# Patient Record
Sex: Female | Born: 1965 | Race: White | Hispanic: No | Marital: Married | Smoking: Former smoker
Health system: Southern US, Community
[De-identification: ages and names within clinical notes are randomized; demographics above are authoritative.]

## PROBLEM LIST (undated history)

## (undated) DIAGNOSIS — M1990 Unspecified osteoarthritis, unspecified site: Secondary | ICD-10-CM

## (undated) DIAGNOSIS — I10 Essential (primary) hypertension: Secondary | ICD-10-CM

## (undated) DIAGNOSIS — K219 Gastro-esophageal reflux disease without esophagitis: Secondary | ICD-10-CM

## (undated) HISTORY — PX: ABDOMINAL HYSTERECTOMY: SHX81

## (undated) HISTORY — PX: KNEE ARTHROPLASTY: SHX992

---

## 2011-08-17 ENCOUNTER — Ambulatory Visit: Payer: Self-pay | Admitting: Family Medicine

## 2013-09-30 ENCOUNTER — Ambulatory Visit: Payer: Self-pay | Admitting: Gastroenterology

## 2014-02-05 DIAGNOSIS — I10 Essential (primary) hypertension: Secondary | ICD-10-CM | POA: Insufficient documentation

## 2014-02-05 DIAGNOSIS — G43909 Migraine, unspecified, not intractable, without status migrainosus: Secondary | ICD-10-CM | POA: Insufficient documentation

## 2014-02-05 DIAGNOSIS — K219 Gastro-esophageal reflux disease without esophagitis: Secondary | ICD-10-CM | POA: Insufficient documentation

## 2014-02-05 DIAGNOSIS — E559 Vitamin D deficiency, unspecified: Secondary | ICD-10-CM | POA: Insufficient documentation

## 2014-02-05 DIAGNOSIS — M129 Arthropathy, unspecified: Secondary | ICD-10-CM | POA: Insufficient documentation

## 2014-02-05 DIAGNOSIS — J309 Allergic rhinitis, unspecified: Secondary | ICD-10-CM | POA: Insufficient documentation

## 2014-02-05 DIAGNOSIS — E785 Hyperlipidemia, unspecified: Secondary | ICD-10-CM | POA: Insufficient documentation

## 2015-09-21 ENCOUNTER — Other Ambulatory Visit: Payer: Self-pay | Admitting: Surgery

## 2015-09-21 DIAGNOSIS — R590 Localized enlarged lymph nodes: Secondary | ICD-10-CM

## 2015-09-28 ENCOUNTER — Ambulatory Visit
Admission: RE | Admit: 2015-09-28 | Discharge: 2015-09-28 | Disposition: A | Discharge status: Home / Self Care | Payer: Managed Care, Other (non HMO) | Source: Ambulatory Visit | Attending: Surgery | Admitting: Surgery

## 2015-09-28 DIAGNOSIS — R590 Localized enlarged lymph nodes: Secondary | ICD-10-CM | POA: Diagnosis present

## 2015-09-28 HISTORY — DX: Essential (primary) hypertension: I10

## 2015-09-28 MED ORDER — IOPAMIDOL (ISOVUE-370) INJECTION 76%
75.0000 mL | Freq: Once | INTRAVENOUS | Status: AC | PRN
  Administered 2015-09-28: 75 mL via INTRAVENOUS

## 2016-04-19 ENCOUNTER — Other Ambulatory Visit: Payer: Self-pay | Admitting: Family Medicine

## 2016-04-19 DIAGNOSIS — Z1231 Encounter for screening mammogram for malignant neoplasm of breast: Secondary | ICD-10-CM

## 2016-04-22 ENCOUNTER — Other Ambulatory Visit: Payer: Self-pay | Admitting: Family Medicine

## 2016-04-22 DIAGNOSIS — Z8781 Personal history of (healed) traumatic fracture: Secondary | ICD-10-CM

## 2016-05-04 ENCOUNTER — Ambulatory Visit
Admission: RE | Admit: 2016-05-04 | Discharge: 2016-05-04 | Disposition: A | Discharge status: Home / Self Care | Payer: BC Managed Care – PPO | Source: Ambulatory Visit | Attending: Family Medicine | Admitting: Family Medicine

## 2016-05-04 DIAGNOSIS — S2232XA Fracture of one rib, left side, initial encounter for closed fracture: Secondary | ICD-10-CM | POA: Diagnosis not present

## 2016-05-04 DIAGNOSIS — Z8781 Personal history of (healed) traumatic fracture: Secondary | ICD-10-CM

## 2016-05-04 DIAGNOSIS — R911 Solitary pulmonary nodule: Secondary | ICD-10-CM | POA: Insufficient documentation

## 2016-05-04 MED ORDER — IOPAMIDOL (ISOVUE-300) INJECTION 61%
75.0000 mL | Freq: Once | INTRAVENOUS | Status: AC | PRN
  Administered 2016-05-04: 75 mL via INTRAVENOUS

## 2016-05-10 ENCOUNTER — Ambulatory Visit: Payer: Managed Care, Other (non HMO)

## 2016-06-03 ENCOUNTER — Other Ambulatory Visit: Payer: Self-pay | Admitting: General Surgery

## 2016-06-03 ENCOUNTER — Other Ambulatory Visit (HOSPITAL_COMMUNITY): Payer: Self-pay | Admitting: General Surgery

## 2016-06-03 DIAGNOSIS — Z6841 Body Mass Index (BMI) 40.0 and over, adult: Principal | ICD-10-CM

## 2016-06-23 ENCOUNTER — Ambulatory Visit
Admission: RE | Admit: 2016-06-23 | Discharge: 2016-06-23 | Disposition: A | Discharge status: Home / Self Care | Payer: BC Managed Care – PPO | Source: Ambulatory Visit | Attending: General Surgery | Admitting: General Surgery

## 2016-06-23 ENCOUNTER — Other Ambulatory Visit: Payer: Self-pay

## 2016-06-23 DIAGNOSIS — K219 Gastro-esophageal reflux disease without esophagitis: Secondary | ICD-10-CM | POA: Insufficient documentation

## 2016-06-23 DIAGNOSIS — K449 Diaphragmatic hernia without obstruction or gangrene: Secondary | ICD-10-CM | POA: Diagnosis not present

## 2016-06-23 DIAGNOSIS — Z01818 Encounter for other preprocedural examination: Secondary | ICD-10-CM | POA: Diagnosis not present

## 2016-06-23 DIAGNOSIS — I4510 Unspecified right bundle-branch block: Secondary | ICD-10-CM | POA: Insufficient documentation

## 2016-06-23 DIAGNOSIS — Z01810 Encounter for preprocedural cardiovascular examination: Secondary | ICD-10-CM | POA: Diagnosis present

## 2016-06-23 DIAGNOSIS — Z6841 Body Mass Index (BMI) 40.0 and over, adult: Secondary | ICD-10-CM | POA: Diagnosis not present

## 2016-06-24 ENCOUNTER — Encounter: Payer: BC Managed Care – PPO | Attending: General Surgery | Admitting: Dietician

## 2016-06-24 VITALS — Ht 67.0 in | Wt 308.0 lb

## 2016-06-24 DIAGNOSIS — Z6841 Body Mass Index (BMI) 40.0 and over, adult: Secondary | ICD-10-CM

## 2016-06-24 DIAGNOSIS — E785 Hyperlipidemia, unspecified: Secondary | ICD-10-CM

## 2016-06-24 DIAGNOSIS — I10 Essential (primary) hypertension: Secondary | ICD-10-CM

## 2016-06-24 DIAGNOSIS — E6609 Other obesity due to excess calories: Secondary | ICD-10-CM | POA: Diagnosis not present

## 2016-06-24 DIAGNOSIS — IMO0001 Reserved for inherently not codable concepts without codable children: Secondary | ICD-10-CM

## 2016-06-24 NOTE — Progress Notes (Signed)
Nutrition Assessment  Date: 06/24/16  Proposed Surgery: Vertical Sleeve Gastrectomy  RE: Stacy Bass   DOB: 2065-10-24  MRN: [id number] MD: Gaynelle AduEric Wilson RD: Connye BurkittPam Vue Pavon  Height: 5'7" Weight: 308.0 lbs with shoes  BMI: 48.24 Upper IBW% (UIBW): 205% (150lbs)  Patient's Goal Weight: 180lbs  Medical History: HTN, hyperlipidemia, GERD, arthritis Medications and Supplements: Tylenol, Ranitidine, Triamterene-HCTZ, Losartan, Meloxicam, Tizanidine  Previous surgeries: partial hysterectomy, knee surgery Drug allergies: none known per patient Food allergies: none known per patient Alcohol use: rarely  Tobacco use: none, quit smoking 2 weeks ago  Physical activity: none due to arthritis pain  Weight history: Childhood: overweight but lean     Adolescence: overweight but lean    Adulthood: 200-280lbs, on and off dieting    Weight 1 year ago: 250-275lbs.  Dieting/ weight loss history: history of yo-yo dieting, has tried multiple diets, including high protein, low carb diets several times, Physician's Weight loss, Weight Watchers, and others. She has been working towards weight loss surgery for several years as weight loss has become very difficult with dieting history and limited activity due to arthritis pain. She reports extensive personal research into weight loss surgery and subsequent diet changes.   Dietary Recall:  Daily pattern = 2 meals and 1-3 snacks. Dining out: 6-8 meals per week. Breakfast: coffee with sugar and creamer 16oz Snack: packaged peanut butter or cheese crackers  Lunch: 1-2pm out 3x a week-- [employer], [employer], [employer]; sometimes leftovers or sandwich with white bread and cold cuts  Snack: sometimes yogurt or protein bar Supper: crock pot meal or cooks; usually quick, convenient--chicken and veg, or pot roast with potatoes and carrots and bread; spaghetti every other week, hot dogs and chips every other week. Limits white starches. Uses ground Mal[place]urkey, 1/2 whole grain pasta. Out 2  times a week--Mexican 1x, seafood or japanese 1x week Snack: occasionally sugar free popsicle, occasionally candy if craving (i.e. 2 fun size packs m&ms) Beverages: water, coffee, crystal light tea, rarely sweet tea, no sodas.    Psychosocial: Emotional eating history: patient reports little stress eating, but does eat when bored Disordered eating history: denies disordered eating   Intervention:  Patient has researched this procedure by online and conversing with friends and family members who have had weight loss surgery.   Instructed her on pre-op diet guidelines, including liver reduction diet.   Discussed stages of the bariatric diet after surgery as well as the importance of adequate protein and fluid intake.   Provided written resources including: Pre-op Goals; Pre-Op Diet (for liver reduction); Diet Stages; and Quick and Healthy Meal Ideas  Summary:  Patient has made diet and lifestyle changes in effort to lose weight and prepare for bariatric surgery.  She has solid support from family members.   She agrees to work on decreasing intake of starchy foods, eating slowly and chewing thoroughly, and eliminating caffeine prior to surgery.   She is motivated to follow the bariatric diet after surgery. From a nutrition standpoint, she is ready to proceed with the bariatric surgery program.    Plan:  Patient commits to returning for pre-op class or RD follow-up visit prior to surgery.   She will plan to return for post-op RD visits beginning 2-3 weeks after surgery.

## 2016-06-24 NOTE — Patient Instructions (Signed)
   Start drinking 1/2-caff coffee, or less coffee to eliminate caffeine prior to surgery  Work on eating slowly and taking small bites of food.   Continue to limit starches and home and at restaurants.

## 2016-08-01 ENCOUNTER — Encounter: Payer: BC Managed Care – PPO | Attending: General Surgery | Admitting: Skilled Nursing Facility1

## 2016-08-01 DIAGNOSIS — Z713 Dietary counseling and surveillance: Secondary | ICD-10-CM | POA: Insufficient documentation

## 2016-08-01 DIAGNOSIS — E7800 Pure hypercholesterolemia, unspecified: Secondary | ICD-10-CM | POA: Insufficient documentation

## 2016-08-01 DIAGNOSIS — I10 Essential (primary) hypertension: Secondary | ICD-10-CM | POA: Insufficient documentation

## 2016-08-01 DIAGNOSIS — Z6841 Body Mass Index (BMI) 40.0 and over, adult: Secondary | ICD-10-CM | POA: Insufficient documentation

## 2016-08-01 DIAGNOSIS — E669 Obesity, unspecified: Secondary | ICD-10-CM

## 2016-08-02 ENCOUNTER — Encounter: Payer: Self-pay | Admitting: Skilled Nursing Facility1

## 2016-08-02 NOTE — Progress Notes (Signed)
  Pre-Operative Nutrition Class:  Appt start time: 7858   End time:  1830.  Patient was seen on 08/01/2016 for Pre-Operative Bariatric Surgery Education at [facility].   Surgery date:  Surgery type: Sleeve Gastrectomy  Start weight at [facility]: 308 Weight today: 313.8  TANITA  BODY COMP RESULTS  N/A   BMI (kg/m^2)    Fat Mass (lbs)    Fat Free Mass (lbs)    Total Body Water (lbs)    Samples given per MNT protocol. Patient educated on appropriate usage: Bariatric Advantage Multivitamin Lot # [id number] Exp: 6/19  Bariatric Advantage Calcium Citrate Lot # [id number] Exp: 10/21/16  Renee Pain Protein Shake Lot # [id number] Exp: 03/15/17  The following the learning objectives were met by the patient during this course:  Identify Pre-Op Dietary Goals and will begin 2 weeks pre-operatively  Identify appropriate sources of fluids and proteins   State protein recommendations and appropriate sources pre and post-operatively  Identify Post-Operative Dietary Goals and will follow for 2 weeks post-operatively  Identify appropriate multivitamin and calcium sources  Describe the need for physical activity post-operatively and will follow MD recommendations  State when to call healthcare provider regarding medication questions or post-operative complications  Handouts given during class include:  Pre-Op Bariatric Surgery Diet Handout  Protein Shake Handout  Post-Op Bariatric Surgery Nutrition Handout  BELT Program Information Flyer  Support Group Information Flyer  [facility] Bariatric Supplements Price List  Follow-Up Plan: Patient will follow-up at [facility] 2 weeks post operatively for diet advancement per MD.

## 2016-08-05 NOTE — Progress Notes (Signed)
NEED ORDERS IN EPIC FOR 4-3- SURGERY PRE OP IS 3-28

## 2016-08-08 NOTE — Patient Instructions (Signed)
Mal AmabileCarol E Radich  08/08/2016   Your procedure is scheduled on: 08-16-16  [facility]  [facility] 3rd floor to  Short Stay Center at [phone].  Call this number if you have problems the morning of surgery [phone]   Remember: ONLY 1 PERSON MAY GO WITH YOU TO SHORT STAY TO GET  READY MORNING OF YOUR SURGERY.  Do not eat food or drink liquids :After Midnight.     Take these medicines the morning of surgery with A SIP OF WATER: ranitidine(Zantac) as needed                                You may not have any metal on your body including hair pins and              piercings  Do not wear jewelry, make-up, lotions, powders or perfumes, deodorant             Do not wear nail polish.  Do not shave  48 hours prior to surgery.              Men may shave face and neck.   Do not bring valuables to the hospital. [facility] IS NOT             RESPONSIBLE   FOR VALUABLES.  Contacts, dentures or bridgework may not be worn into surgery.  Leave suitcase in the car. After surgery it may be brought to your room.                Please read over the following fact sheets you were given: _____________________________________________________________________             [facility] - Preparing for [facility] Before surgery, you can play an important role.  Because skin is not sterile, your skin needs to be as free of germs as possible.  You can reduce the number of germs on your skin by washing with CHG (chlorahexidine gluconate) soap before surgery.  CHG is an antiseptic cleaner which kills germs and bonds with the skin to continue killing germs even after washing. Please DO NOT use if you have an allergy to CHG or antibacterial soaps.  If your skin becomes reddened/irritated stop using the CHG and inform your nurse when you arrive at Short Stay. Do not shave (including legs and underarms) for at least 48 hours prior to the first CHG shower.  You may  shave your face/neck. Please follow these instructions carefully:  1.  Shower with CHG Soap the night before surgery and the  morning of Surgery.  2.  If you choose to wash your hair, wash your hair first as usual with your  normal  shampoo.  3.  After you shampoo, rinse your hair and body thoroughly to remove the  shampoo.                           4.  Use CHG as you would any other liquid soap.  You can apply chg directly  to the skin and wash                       Gently with a scrungie or clean washcloth.  5.  Apply the CHG Soap to  your body ONLY FROM THE NECK DOWN.   Do not use on face/ open                           Wound or open sores. Avoid contact with eyes, ears mouth and genitals (private parts).                       Wash face,  Genitals (private parts) with your normal soap.             6.  Wash thoroughly, paying special attention to the area where your surgery  will be performed.  7.  Thoroughly rinse your body with warm water from the neck down.  8.  DO NOT shower/wash with your normal soap after using and rinsing off  the CHG Soap.                9.  Pat yourself dry with a clean towel.            10.  Wear clean pajamas.            11.  Place clean sheets on your bed the night of your first shower and do not  sleep with pets. Day of Surgery : Do not apply any lotions/deodorants the morning of surgery.  Please wear clean clothes to the hospital/surgery center.  FAILURE TO FOLLOW THESE INSTRUCTIONS MAY RESULT IN THE CANCELLATION OF YOUR SURGERY PATIENT SIGNATURE_________________________________  NURSE SIGNATURE__________________________________  ________________________________________________________________________

## 2016-08-08 NOTE — Progress Notes (Signed)
CT chest 05-04-16 epic EKG 06-23-16 epic

## 2016-08-10 ENCOUNTER — Other Ambulatory Visit: Payer: Self-pay | Admitting: General Surgery

## 2016-08-10 ENCOUNTER — Ambulatory Visit: Payer: Self-pay | Admitting: General Surgery

## 2016-08-10 ENCOUNTER — Encounter (HOSPITAL_COMMUNITY)
Admission: RE | Admit: 2016-08-10 | Discharge: 2016-08-10 | Disposition: A | Discharge status: Home / Self Care | Payer: BC Managed Care – PPO | Source: Ambulatory Visit | Attending: General Surgery | Admitting: General Surgery

## 2016-08-10 ENCOUNTER — Encounter (HOSPITAL_COMMUNITY): Payer: Self-pay

## 2016-08-10 DIAGNOSIS — Z6841 Body Mass Index (BMI) 40.0 and over, adult: Secondary | ICD-10-CM | POA: Insufficient documentation

## 2016-08-10 DIAGNOSIS — I10 Essential (primary) hypertension: Secondary | ICD-10-CM | POA: Insufficient documentation

## 2016-08-10 DIAGNOSIS — K219 Gastro-esophageal reflux disease without esophagitis: Secondary | ICD-10-CM | POA: Diagnosis not present

## 2016-08-10 DIAGNOSIS — E559 Vitamin D deficiency, unspecified: Secondary | ICD-10-CM | POA: Insufficient documentation

## 2016-08-10 DIAGNOSIS — Z01812 Encounter for preprocedural laboratory examination: Secondary | ICD-10-CM | POA: Diagnosis present

## 2016-08-10 HISTORY — DX: Morbid (severe) obesity due to excess calories: E66.01

## 2016-08-10 HISTORY — DX: Unspecified osteoarthritis, unspecified site: M19.90

## 2016-08-10 HISTORY — DX: Gastro-esophageal reflux disease without esophagitis: K21.9

## 2016-08-10 LAB — BASIC METABOLIC PANEL
Anion gap: 8 (ref 5–15)
BUN: 21 mg/dL — AB (ref 6–20)
CHLORIDE: 103 mmol/L (ref 101–111)
CO2: 25 mmol/L (ref 22–32)
Calcium: 9.2 mg/dL (ref 8.9–10.3)
Creatinine, Ser: 1 mg/dL (ref 0.44–1.00)
GFR calc Af Amer: >60 mL/min (ref >60)
GFR calc non Af Amer: >60 mL/min (ref >60)
GLUCOSE: 92 mg/dL (ref 65–99)
Potassium: 3.9 mmol/L (ref 3.5–5.1)
Sodium: 136 mmol/L (ref 135–145)

## 2016-08-10 LAB — CBC
HEMATOCRIT: 42.4 % (ref 36.0–46.0)
HEMOGLOBIN: 14.2 g/dL (ref 12.0–15.0)
MCH: 28 pg (ref 26.0–34.0)
MCHC: 33.5 g/dL (ref 30.0–36.0)
MCV: 83.5 fL (ref 78.0–100.0)
Platelets: 215 10*3/uL (ref 150–400)
RBC: 5.08 MIL/uL (ref 3.87–5.11)
RDW: 14.2 % (ref 11.5–15.5)
WBC: 7.2 10*3/uL (ref 4.0–10.5)

## 2016-08-11 ENCOUNTER — Ambulatory Visit: Payer: Self-pay | Admitting: General Surgery

## 2016-08-11 NOTE — H&P (Signed)
Stacy Bass 08/10/2016 12:00 PM Location: [facility] Patient #: [id number] DOB: 01/04/1966 Married / Language: English / Race: White Female  History of Present Illness (Ha Placeres M. Ruxin Ransome MD; 08/11/2016 1:02 PM) The patient is a 50 year old female who presents for a preoperative evaluation. She comes in today for her preoperative evaluation. She denies any medical changes since she was initially seen in January. Her initial visit weight was 307 pounds. She denies any trips the emergency room. She denies any chest pain, chest pressure, shortness of breath, dyspnea on exertion, orthopnea. She does state reflux medication on a daily basis. She denies any melena or hematochezia. She denies any dysuria. She denies any TIAs or amaurosis fugax. She saw to nutrition and psychology both of which cleared her for surgery. She also stop smoking. Her urine nicotine was negative. She went today for another urine nicotine test. Her upper GI showed a transient small hiatal hernia with mild reflux. Her bariatric evaluation labs were unremarkable except for a vitamin D level of 29. She takes 50,000 units of vitamin D once a week.  Review of systems-conference a 12 point review systems was performed and all systems are negative a consent for what is mentioned in the HPI   Problem List/Past Medical (Joselle Deeds M. Numa Schroeter, MD; 08/11/2016 1:04 PM) PRE-OP TESTING (Z01.818) HIATAL HERNIA (K44.9) MORBID OBESITY WITH BMI OF 45.0-49.9, ADULT (E66.01)  Past Surgical History (Zeev Deakins M. Soham Hollett, MD; 08/11/2016 1:04 PM) Hysterectomy (not due to cancer) - Partial Knee Surgery Bilateral.  Diagnostic Studies History (Tyniah Kastens M. Arnold Kester, MD; 08/11/2016 1:04 PM) Colonoscopy 1-5 years ago Mammogram within last year Pap Smear 1-5 years ago  Allergies (Telesha Deguzman M. San Lohmeyer, MD; 08/11/2016 1:04 PM) No Known Drug Allergies 05/31/2016  Medication History (Percilla Tweten M. Royale Lennartz, MD; 08/11/2016 1:04 PM) Medications  Reconciled OxyCODONE HCl (5MG/5ML Solution, 5-10 Milliliter Oral every four hours, as needed, Taken starting 08/10/2016) Active. Zofran ODT (4MG Tablet Disint, 1 (one) Tablet Disperse Oral every six hours, as needed, Taken starting 08/10/2016) Active. Losartan Potassium (100MG Tablet, Oral) Active. Meloxicam (15MG Tablet, Oral) Active. Triamterene-HCTZ (37.5-25MG Tablet, Oral) Active. TiZANidine HCl (4MG Tablet, Oral) Active. Vitamin D (Ergocalciferol) (50000UNIT Capsule, Oral) Active. RaNITidine HCl (150MG Capsule, Oral) Active. Benadryl Allergy (25MG Capsule, Oral) Active. Tylenol (500MG Capsule, Oral) Active.  Social History (Mellony Danziger M. Mccartney Chuba, MD; 08/11/2016 1:04 PM) Alcohol use Occasional alcohol use. Caffeine use Coffee. No drug use Tobacco use Current every day smoker.  Family History (Lynanne Delgreco M. Keelia Graybill, MD; 08/11/2016 1:04 PM) Anesthetic complications Family Members In General. Arthritis Family Members In General, Father. Cervical Cancer Mother. Colon Cancer Family Members In General. Colon Polyps Father. Depression Mother, Sister. Diabetes Mellitus Mother. Heart disease in female family member before age 55 Hypertension Family Members In General, Father, Mother. Melanoma Father. Migraine Headache Mother.  Pregnancy / Birth History (Sharlena Kristensen M. Timaya Bojarski, MD; 08/11/2016 1:04 PM) Age at menarche 11 years. Contraceptive History Oral contraceptives. Gravida 6 Maternal age 26-30 Para 1  Other Problems (Delainy Mcelhiney M. Baylor Cortez, MD; 08/11/2016 1:04 PM) Arthritis Hemorrhoids Migraine Headache HYPERCHOLESTEREMIA (E78.00) ESSENTIAL HYPERTENSION (I10) BACK PAIN, LUMBOSACRAL (M54.5) GASTROESOPHAGEAL REFLUX DISEASE, ESOPHAGITIS PRESENCE NOT SPECIFIED (K21.9)     Review of Systems (Catarina Huntley M. Nidal Rivet MD; 08/11/2016 1:03 PM) General Present- Night Sweats. Not Present- Appetite Loss, Chills, Fatigue, Fever, Weight Gain and Weight Loss. Skin Not Present- Change in  Wart/Mole, Dryness, Hives, Jaundice, New Lesions, Non-Healing Wounds, Rash and Ulcer. HEENT Present- Ringing in the Ears and Wears glasses/contact lenses. Not Present- Earache,   Hearing Loss, Hoarseness, Nose Bleed, Oral Ulcers, Seasonal Allergies, Sinus Pain, Sore Throat, Visual Disturbances and Yellow Eyes. Respiratory Present- Snoring. Not Present- Bloody sputum, Chronic Cough, Difficulty Breathing and Wheezing. Breast Not Present- Breast Mass, Breast Pain, Nipple Discharge and Skin Changes. Cardiovascular Not Present- Chest Pain, Difficulty Breathing Lying Down, Leg Cramps, Palpitations, Rapid Heart Rate, Shortness of Breath and Swelling of Extremities. Gastrointestinal Present- Indigestion. Not Present- Abdominal Pain, Bloating, Bloody Stool, Change in Bowel Habits, Chronic diarrhea, Constipation, Difficulty Swallowing, Excessive gas, Gets full quickly at meals, Hemorrhoids, Nausea, Rectal Pain and Vomiting. Female Genitourinary Not Present- Frequency, Nocturia, Painful Urination, Pelvic Pain and Urgency. Musculoskeletal Present- Back Pain, Joint Pain, Joint Stiffness and Swelling of Extremities. Not Present- Muscle Pain and Muscle Weakness. Neurological Present- Numbness and Tingling. Not Present- Decreased Memory, Fainting, Headaches, Seizures, Tremor, Trouble walking and Weakness. Psychiatric Not Present- Anxiety, Bipolar, Change in Sleep Pattern, Depression, Fearful and Frequent crying. Endocrine Present- Hot flashes. Not Present- Cold Intolerance, Excessive Hunger, Hair Changes, Heat Intolerance and New Diabetes. Hematology Not Present- Blood Thinners, Easy Bruising, Excessive bleeding, Gland problems, HIV and Persistent Infections.  Vitals (Alisha Spillers CMA; 08/10/2016 11:43 AM) 08/10/2016 11:43 AM Weight: 305 lb Height: 66in Body Surface Area: 2.39 m Body Mass Index: 49.23 kg/m  Temp.: 98.9F(Oral)  Pulse: 86 (Regular)  BP: 128/82 (Sitting, Left Arm,  Standard)      Physical Exam (Karalyn Kadel M. Ezella Kell MD; 08/11/2016 1:00 PM)  General Mental Status-Alert. General Appearance-Consistent with stated age. Hydration-Well hydrated. Voice-Normal. Note: central truncal obesity  Head and Neck Head-normocephalic, atraumatic with no lesions or palpable masses. Trachea-midline. Thyroid Gland Characteristics - normal size and consistency.  Eye Eyeball - Bilateral-Extraocular movements intact. Sclera/Conjunctiva - Bilateral-No scleral icterus.  Chest and Lung Exam Chest and lung exam reveals -quiet, even and easy respiratory effort with no use of accessory muscles and on auscultation, normal breath sounds, no adventitious sounds and normal vocal resonance. Inspection Chest Wall - Normal. Back - normal.  Breast - Did not examine.  Cardiovascular Cardiovascular examination reveals -normal heart sounds, regular rate and rhythm with no murmurs and normal pedal pulses bilaterally.  Abdomen Inspection Inspection of the abdomen reveals - No Hernias. Skin - Scar - no surgical scars. Palpation/Percussion Palpation and Percussion of the abdomen reveal - Soft, Non Tender, No Rebound tenderness, No Rigidity (guarding) and No hepatosplenomegaly. Auscultation Auscultation of the abdomen reveals - Bowel sounds normal.  Peripheral Vascular Upper Extremity Palpation - Pulses bilaterally normal.  Neurologic Neurologic evaluation reveals -alert and oriented x 3 with no impairment of recent or remote memory. Mental Status-Normal.  Neuropsychiatric The patient's mood and affect are described as -normal. Judgment and Insight-insight is appropriate concerning matters relevant to self.  Musculoskeletal Normal Exam - Left-Upper Extremity Strength Normal and Lower Extremity Strength Normal. Normal Exam - Right-Upper Extremity Strength Normal and Lower Extremity Strength Normal.  Lymphatic Head & Neck  General Head &  Neck Lymphatics: Bilateral - Description - Normal. Axillary - Did not examine. Femoral & Inguinal - Did not examine.    Assessment & Plan (Shwanda Soltis M. Lilyian Quayle MD; 08/11/2016 1:04 PM)  MORBID OBESITY WITH BMI OF 45.0-49.9, ADULT (E66.01) Impression: I congratulated her on her smoking cessation. We discussed the importance of the preoperative meal plan. We rediscussed the typical postoperative course and recovery. We discussed the findings of a small hiatal hernia possibly on her upper GI. I explained that I would test for one intraoperatively. If found to have a clinically significant 1 we discussed what   repair would involve. All of her questions were asked and answered. She was given her postoperative prescriptions today.  Current Plans Pt Education - EMW_preopbariatric ESSENTIAL HYPERTENSION (I10)  HYPERCHOLESTEREMIA (E78.00)  BACK PAIN, LUMBOSACRAL (M54.5)  GASTROESOPHAGEAL REFLUX DISEASE, ESOPHAGITIS PRESENCE NOT SPECIFIED (K21.9) Impression: we discussed that reflux may actually worsen with sleeve gastrectomy.  HIATAL HERNIA (K44.9) Impression: See above  Maurie Olesen M. Jamilette Suchocki, MD, FACS General, Bariatric, & Minimally Invasive Surgery [facility]   

## 2016-08-16 ENCOUNTER — Inpatient Hospital Stay (HOSPITAL_COMMUNITY)
Admission: RE | Admit: 2016-08-16 | Discharge: 2016-08-17 | DRG: 621 | Disposition: A | Discharge status: Home / Self Care | Payer: BC Managed Care – PPO | Source: Ambulatory Visit | Attending: General Surgery | Admitting: General Surgery

## 2016-08-16 ENCOUNTER — Encounter (HOSPITAL_COMMUNITY)
Admission: RE | Disposition: A | Discharge status: Home / Self Care | Payer: Self-pay | Source: Ambulatory Visit | Attending: General Surgery

## 2016-08-16 ENCOUNTER — Inpatient Hospital Stay (HOSPITAL_COMMUNITY): Payer: BC Managed Care – PPO | Admitting: Registered Nurse

## 2016-08-16 ENCOUNTER — Encounter (HOSPITAL_COMMUNITY): Payer: Self-pay

## 2016-08-16 DIAGNOSIS — Z79891 Long term (current) use of opiate analgesic: Secondary | ICD-10-CM

## 2016-08-16 DIAGNOSIS — Z800 Family history of malignant neoplasm of digestive organs: Secondary | ICD-10-CM | POA: Diagnosis not present

## 2016-08-16 DIAGNOSIS — M1990 Unspecified osteoarthritis, unspecified site: Secondary | ICD-10-CM | POA: Diagnosis present

## 2016-08-16 DIAGNOSIS — Z6841 Body Mass Index (BMI) 40.0 and over, adult: Secondary | ICD-10-CM

## 2016-08-16 DIAGNOSIS — Z818 Family history of other mental and behavioral disorders: Secondary | ICD-10-CM

## 2016-08-16 DIAGNOSIS — Z9884 Bariatric surgery status: Secondary | ICD-10-CM

## 2016-08-16 DIAGNOSIS — M545 Low back pain: Secondary | ICD-10-CM | POA: Diagnosis present

## 2016-08-16 DIAGNOSIS — Z79899 Other long term (current) drug therapy: Secondary | ICD-10-CM

## 2016-08-16 DIAGNOSIS — G43909 Migraine, unspecified, not intractable, without status migrainosus: Secondary | ICD-10-CM | POA: Diagnosis present

## 2016-08-16 DIAGNOSIS — K219 Gastro-esophageal reflux disease without esophagitis: Secondary | ICD-10-CM | POA: Diagnosis present

## 2016-08-16 DIAGNOSIS — Z8371 Family history of colonic polyps: Secondary | ICD-10-CM

## 2016-08-16 DIAGNOSIS — Z8049 Family history of malignant neoplasm of other genital organs: Secondary | ICD-10-CM | POA: Diagnosis not present

## 2016-08-16 DIAGNOSIS — Z791 Long term (current) use of non-steroidal anti-inflammatories (NSAID): Secondary | ICD-10-CM

## 2016-08-16 DIAGNOSIS — Z8249 Family history of ischemic heart disease and other diseases of the circulatory system: Secondary | ICD-10-CM | POA: Diagnosis not present

## 2016-08-16 DIAGNOSIS — I10 Essential (primary) hypertension: Secondary | ICD-10-CM | POA: Diagnosis present

## 2016-08-16 DIAGNOSIS — K449 Diaphragmatic hernia without obstruction or gangrene: Secondary | ICD-10-CM | POA: Diagnosis present

## 2016-08-16 DIAGNOSIS — E785 Hyperlipidemia, unspecified: Secondary | ICD-10-CM | POA: Diagnosis present

## 2016-08-16 DIAGNOSIS — Z833 Family history of diabetes mellitus: Secondary | ICD-10-CM

## 2016-08-16 DIAGNOSIS — Z885 Allergy status to narcotic agent status: Secondary | ICD-10-CM

## 2016-08-16 DIAGNOSIS — Z8261 Family history of arthritis: Secondary | ICD-10-CM

## 2016-08-16 DIAGNOSIS — K649 Unspecified hemorrhoids: Secondary | ICD-10-CM | POA: Diagnosis present

## 2016-08-16 DIAGNOSIS — Z90711 Acquired absence of uterus with remaining cervical stump: Secondary | ICD-10-CM | POA: Diagnosis not present

## 2016-08-16 HISTORY — PX: LAPAROSCOPIC GASTRIC SLEEVE RESECTION WITH HIATAL HERNIA REPAIR: SHX6512

## 2016-08-16 LAB — HEPATIC FUNCTION PANEL
ALBUMIN: 3.9 g/dL (ref 3.5–5.0)
ALT: 22 U/L (ref 14–54)
AST: 19 U/L (ref 15–41)
Alkaline Phosphatase: 63 U/L (ref 38–126)
TOTAL PROTEIN: 7 g/dL (ref 6.5–8.1)
Total Bilirubin: 0.5 mg/dL (ref 0.3–1.2)

## 2016-08-16 LAB — HEMOGLOBIN AND HEMATOCRIT, BLOOD
HCT: 43.2 % (ref 36.0–46.0)
Hemoglobin: 14.4 g/dL (ref 12.0–15.0)

## 2016-08-16 SURGERY — GASTRECTOMY, SLEEVE, LAPAROSCOPIC, WITH HIATAL HERNIA REPAIR
Anesthesia: General | Site: Abdomen

## 2016-08-16 MED ORDER — SODIUM CHLORIDE 0.9 % IJ SOLN
INTRAMUSCULAR | Status: DC | PRN
  Administered 2016-08-16: 50 mL

## 2016-08-16 MED ORDER — LACTATED RINGERS IR SOLN
Status: DC | PRN
  Administered 2016-08-16: 1000 mL

## 2016-08-16 MED ORDER — SUGAMMADEX SODIUM 500 MG/5ML IV SOLN
INTRAVENOUS | Status: DC | PRN
  Administered 2016-08-16: 300 mg via INTRAVENOUS

## 2016-08-16 MED ORDER — HYDROMORPHONE HCL 1 MG/ML IJ SOLN
INTRAMUSCULAR | Status: AC
  Filled 2016-08-16: qty 1

## 2016-08-16 MED ORDER — PROMETHAZINE HCL 25 MG/ML IJ SOLN: 12.5000 mg | Freq: Four times a day (QID) | INTRAMUSCULAR | Status: DC | PRN

## 2016-08-16 MED ORDER — ROCURONIUM BROMIDE 50 MG/5ML IV SOSY
PREFILLED_SYRINGE | INTRAVENOUS | Status: AC
  Filled 2016-08-16: qty 5

## 2016-08-16 MED ORDER — LIDOCAINE 2% (20 MG/ML) 5 ML SYRINGE
INTRAMUSCULAR | Status: DC | PRN
  Administered 2016-08-16: 100 mg via INTRAVENOUS

## 2016-08-16 MED ORDER — PREMIER PROTEIN SHAKE: 2.0000 [oz_av] | ORAL | Status: DC

## 2016-08-16 MED ORDER — KCL IN DEXTROSE-NACL 20-5-0.45 MEQ/L-%-% IV SOLN
INTRAVENOUS | Status: DC
  Administered 2016-08-16: 125 mL/h via INTRAVENOUS
  Administered 2016-08-16 – 2016-08-17 (×2): via INTRAVENOUS
  Filled 2016-08-16 (×4): qty 1000

## 2016-08-16 MED ORDER — ACETAMINOPHEN 160 MG/5ML PO SOLN
325.0000 mg | ORAL | Status: DC | PRN
  Administered 2016-08-17: 650 mg via ORAL
  Filled 2016-08-16: qty 20.3

## 2016-08-16 MED ORDER — GABAPENTIN 300 MG PO CAPS
300.0000 mg | ORAL_CAPSULE | ORAL | Status: AC
  Administered 2016-08-16: 300 mg via ORAL
  Filled 2016-08-16: qty 1

## 2016-08-16 MED ORDER — CEFOTETAN DISODIUM-DEXTROSE 2-2.08 GM-% IV SOLR
2.0000 g | INTRAVENOUS | Status: AC
  Administered 2016-08-16: 2 g via INTRAVENOUS

## 2016-08-16 MED ORDER — PANTOPRAZOLE SODIUM 40 MG IV SOLR
40.0000 mg | Freq: Every day | INTRAVENOUS | Status: DC
  Administered 2016-08-16: 40 mg via INTRAVENOUS
  Filled 2016-08-16: qty 40

## 2016-08-16 MED ORDER — 0.9 % SODIUM CHLORIDE (POUR BTL) OPTIME
TOPICAL | Status: DC | PRN
  Administered 2016-08-16: 1000 mL

## 2016-08-16 MED ORDER — FENTANYL CITRATE (PF) 100 MCG/2ML IJ SOLN
INTRAMUSCULAR | Status: DC | PRN
  Administered 2016-08-16 (×2): 50 ug via INTRAVENOUS
  Administered 2016-08-16: 100 ug via INTRAVENOUS

## 2016-08-16 MED ORDER — DEXAMETHASONE SODIUM PHOSPHATE 10 MG/ML IJ SOLN
INTRAMUSCULAR | Status: AC
  Filled 2016-08-16: qty 1

## 2016-08-16 MED ORDER — EPHEDRINE 5 MG/ML INJ
INTRAVENOUS | Status: AC
  Filled 2016-08-16: qty 10

## 2016-08-16 MED ORDER — ROCURONIUM BROMIDE 10 MG/ML (PF) SYRINGE
PREFILLED_SYRINGE | INTRAVENOUS | Status: DC | PRN
Start: 2016-08-16 — End: 2016-08-16
  Administered 2016-08-16: 50 mg via INTRAVENOUS
  Administered 2016-08-16 (×2): 10 mg via INTRAVENOUS

## 2016-08-16 MED ORDER — LIDOCAINE 2% (20 MG/ML) 5 ML SYRINGE
INTRAMUSCULAR | Status: AC
  Filled 2016-08-16: qty 5

## 2016-08-16 MED ORDER — SODIUM CHLORIDE 0.9 % IJ SOLN
INTRAMUSCULAR | Status: AC
  Filled 2016-08-16: qty 50

## 2016-08-16 MED ORDER — CEFOTETAN DISODIUM-DEXTROSE 2-2.08 GM-% IV SOLR
INTRAVENOUS | Status: AC
  Filled 2016-08-16: qty 50

## 2016-08-16 MED ORDER — STERILE WATER FOR IRRIGATION IR SOLN
Status: DC | PRN
  Administered 2016-08-16: 1000 mL

## 2016-08-16 MED ORDER — CHLORHEXIDINE GLUCONATE CLOTH 2 % EX PADS: 6.0000 | MEDICATED_PAD | Freq: Once | CUTANEOUS | Status: DC

## 2016-08-16 MED ORDER — ONDANSETRON HCL 4 MG/2ML IJ SOLN: 4.0000 mg | Freq: Once | INTRAMUSCULAR | Status: DC | PRN

## 2016-08-16 MED ORDER — APREPITANT 40 MG PO CAPS
40.0000 mg | ORAL_CAPSULE | ORAL | Status: AC
  Administered 2016-08-16: 40 mg via ORAL
  Filled 2016-08-16: qty 1

## 2016-08-16 MED ORDER — SCOPOLAMINE 1 MG/3DAYS TD PT72
MEDICATED_PATCH | TRANSDERMAL | Status: DC | PRN
  Administered 2016-08-16: 1 via TRANSDERMAL

## 2016-08-16 MED ORDER — SCOPOLAMINE 1 MG/3DAYS TD PT72
MEDICATED_PATCH | TRANSDERMAL | Status: AC
  Filled 2016-08-16: qty 1

## 2016-08-16 MED ORDER — HEPARIN SODIUM (PORCINE) 5000 UNIT/ML IJ SOLN
5000.0000 [IU] | Freq: Once | INTRAMUSCULAR | Status: AC
  Administered 2016-08-16: 5000 [IU] via SUBCUTANEOUS
  Filled 2016-08-16: qty 1

## 2016-08-16 MED ORDER — ENOXAPARIN SODIUM 30 MG/0.3ML ~~LOC~~ SOLN
30.0000 mg | Freq: Two times a day (BID) | SUBCUTANEOUS | Status: DC
  Administered 2016-08-16 – 2016-08-17 (×2): 30 mg via SUBCUTANEOUS
  Filled 2016-08-16 (×2): qty 0.3

## 2016-08-16 MED ORDER — DIPHENHYDRAMINE HCL 50 MG/ML IJ SOLN: 12.5000 mg | Freq: Three times a day (TID) | INTRAMUSCULAR | Status: DC | PRN

## 2016-08-16 MED ORDER — ACETAMINOPHEN 500 MG PO TABS
1000.0000 mg | ORAL_TABLET | ORAL | Status: AC
  Administered 2016-08-16: 1000 mg via ORAL
  Filled 2016-08-16: qty 2

## 2016-08-16 MED ORDER — BUPIVACAINE LIPOSOME 1.3 % IJ SUSP
20.0000 mL | Freq: Once | INTRAMUSCULAR | Status: AC
  Administered 2016-08-16: 20 mL
  Filled 2016-08-16: qty 20

## 2016-08-16 MED ORDER — SUCCINYLCHOLINE CHLORIDE 200 MG/10ML IV SOSY
PREFILLED_SYRINGE | INTRAVENOUS | Status: DC | PRN
Start: 2016-08-16 — End: 2016-08-16

## 2016-08-16 MED ORDER — ONDANSETRON HCL 4 MG/2ML IJ SOLN
4.0000 mg | Freq: Four times a day (QID) | INTRAMUSCULAR | Status: DC | PRN
  Administered 2016-08-16: 4 mg via INTRAVENOUS
  Filled 2016-08-16: qty 2

## 2016-08-16 MED ORDER — SUCCINYLCHOLINE CHLORIDE 200 MG/10ML IV SOSY
PREFILLED_SYRINGE | INTRAVENOUS | Status: AC
  Filled 2016-08-16: qty 10

## 2016-08-16 MED ORDER — LACTATED RINGERS IV SOLN: INTRAVENOUS | Status: DC

## 2016-08-16 MED ORDER — FENTANYL CITRATE (PF) 100 MCG/2ML IJ SOLN
25.0000 ug | INTRAMUSCULAR | Status: DC | PRN
  Administered 2016-08-16 (×3): 25 ug via INTRAVENOUS
  Filled 2016-08-16 (×3): qty 2

## 2016-08-16 MED ORDER — ACETAMINOPHEN 325 MG PO TABS: 650.0000 mg | ORAL_TABLET | ORAL | Status: DC | PRN

## 2016-08-16 MED ORDER — MEPERIDINE HCL 50 MG/ML IJ SOLN: 6.2500 mg | INTRAMUSCULAR | Status: DC | PRN

## 2016-08-16 MED ORDER — OXYCODONE HCL 5 MG/5ML PO SOLN
5.0000 mg | ORAL | Status: DC | PRN
  Administered 2016-08-16: 10 mg via ORAL
  Administered 2016-08-16: 5 mg via ORAL
  Administered 2016-08-17 (×2): 10 mg via ORAL
  Filled 2016-08-16: qty 5
  Filled 2016-08-16: qty 10
  Filled 2016-08-16: qty 5
  Filled 2016-08-16 (×2): qty 10

## 2016-08-16 MED ORDER — MIDAZOLAM HCL 2 MG/2ML IJ SOLN
INTRAMUSCULAR | Status: AC
  Filled 2016-08-16: qty 2

## 2016-08-16 MED ORDER — HYDROMORPHONE HCL 1 MG/ML IJ SOLN
0.2500 mg | INTRAMUSCULAR | Status: DC | PRN
  Administered 2016-08-16 (×3): 0.5 mg via INTRAVENOUS

## 2016-08-16 MED ORDER — FENTANYL CITRATE (PF) 100 MCG/2ML IJ SOLN
INTRAMUSCULAR | Status: AC
  Filled 2016-08-16: qty 2

## 2016-08-16 MED ORDER — DEXAMETHASONE SODIUM PHOSPHATE 10 MG/ML IJ SOLN
INTRAMUSCULAR | Status: DC | PRN
  Administered 2016-08-16: 10 mg via INTRAVENOUS

## 2016-08-16 MED ORDER — MIDAZOLAM HCL 5 MG/5ML IJ SOLN
INTRAMUSCULAR | Status: DC | PRN
  Administered 2016-08-16: 2 mg via INTRAVENOUS

## 2016-08-16 MED ORDER — ONDANSETRON HCL 4 MG/2ML IJ SOLN
INTRAMUSCULAR | Status: AC
  Filled 2016-08-16: qty 2

## 2016-08-16 MED ORDER — PROPOFOL 10 MG/ML IV BOLUS
INTRAVENOUS | Status: AC
  Filled 2016-08-16: qty 40

## 2016-08-16 MED ORDER — LABETALOL HCL 5 MG/ML IV SOLN
5.0000 mg | INTRAVENOUS | Status: DC | PRN
  Administered 2016-08-16: 5 mg via INTRAVENOUS
  Filled 2016-08-16: qty 4

## 2016-08-16 MED ORDER — ONDANSETRON HCL 4 MG/2ML IJ SOLN
INTRAMUSCULAR | Status: DC | PRN
  Administered 2016-08-16: 4 mg via INTRAVENOUS

## 2016-08-16 MED ORDER — SUCCINYLCHOLINE CHLORIDE 200 MG/10ML IV SOSY
PREFILLED_SYRINGE | INTRAVENOUS | Status: DC | PRN
Start: 2016-08-16 — End: 2016-08-16
  Administered 2016-08-16: 160 mg via INTRAVENOUS

## 2016-08-16 MED ORDER — ENALAPRILAT 1.25 MG/ML IV SOLN
1.2500 mg | Freq: Four times a day (QID) | INTRAVENOUS | Status: DC | PRN
  Filled 2016-08-16: qty 1

## 2016-08-16 MED ORDER — LACTATED RINGERS IV SOLN
INTRAVENOUS | Status: DC | PRN
  Administered 2016-08-16: 07:00:00 via INTRAVENOUS

## 2016-08-16 MED ORDER — EPHEDRINE SULFATE-NACL 50-0.9 MG/10ML-% IV SOSY
PREFILLED_SYRINGE | INTRAVENOUS | Status: DC | PRN
  Administered 2016-08-16: 10 mg via INTRAVENOUS

## 2016-08-16 MED ORDER — PROPOFOL 10 MG/ML IV BOLUS
INTRAVENOUS | Status: DC | PRN
  Administered 2016-08-16: 250 mg via INTRAVENOUS

## 2016-08-16 MED ORDER — SUGAMMADEX SODIUM 500 MG/5ML IV SOLN
INTRAVENOUS | Status: AC
  Filled 2016-08-16: qty 5

## 2016-08-16 SURGICAL SUPPLY — 70 items
APPLICATOR COTTON TIP 6IN STRL (MISCELLANEOUS) IMPLANT
APPLIER CLIP ROT 10 11.4 M/L (STAPLE)
APPLIER CLIP ROT 13.4 12 LRG (CLIP)
BANDAGE ADH SHEER 1  50/CT (GAUZE/BANDAGES/DRESSINGS) ×18 IMPLANT
BENZOIN TINCTURE PRP APPL 2/3 (GAUZE/BANDAGES/DRESSINGS) ×3 IMPLANT
BLADE SURG SZ11 CARB STEEL (BLADE) ×3 IMPLANT
CABLE HIGH FREQUENCY MONO STRZ (ELECTRODE) ×3 IMPLANT
CHLORAPREP W/TINT 26ML (MISCELLANEOUS) ×6 IMPLANT
CLIP APPLIE ROT 10 11.4 M/L (STAPLE) IMPLANT
CLIP APPLIE ROT 13.4 12 LRG (CLIP) IMPLANT
CLOSURE WOUND 1/2 X4 (GAUZE/BANDAGES/DRESSINGS) ×1
COVER SURGICAL LIGHT HANDLE (MISCELLANEOUS) ×3 IMPLANT
DECANTER SPIKE VIAL GLASS SM (MISCELLANEOUS) ×3 IMPLANT
DERMABOND ADVANCED (GAUZE/BANDAGES/DRESSINGS) ×2
DERMABOND ADVANCED .7 DNX12 (GAUZE/BANDAGES/DRESSINGS) ×1 IMPLANT
DEVICE SUT QUICK LOAD TK 5 (STAPLE) ×2 IMPLANT
DEVICE SUT TI-KNOT TK 5X26 (MISCELLANEOUS) ×2 IMPLANT
DEVICE SUTURE ENDOST 10MM (ENDOMECHANICALS) IMPLANT
DEVICE TI KNOT TK5 (MISCELLANEOUS) ×1
DEVICE TROCAR PUNCTURE CLOSURE (ENDOMECHANICALS) IMPLANT
DRAPE UTILITY XL STRL (DRAPES) ×6 IMPLANT
ELECT L-HOOK LAP 45CM DISP (ELECTROSURGICAL)
ELECT PENCIL ROCKER SW 15FT (MISCELLANEOUS) IMPLANT
ELECT REM PT RETURN 15FT ADLT (MISCELLANEOUS) ×3 IMPLANT
ELECTRODE L-HOOK LAP 45CM DISP (ELECTROSURGICAL) IMPLANT
GAUZE SPONGE 2X2 8PLY STRL LF (GAUZE/BANDAGES/DRESSINGS) ×1 IMPLANT
GAUZE SPONGE 4X4 12PLY STRL (GAUZE/BANDAGES/DRESSINGS) IMPLANT
GLOVE BIO SURGEON STRL SZ7.5 (GLOVE) ×3 IMPLANT
GLOVE INDICATOR 8.0 STRL GRN (GLOVE) ×3 IMPLANT
GOWN STRL REUS W/TWL XL LVL3 (GOWN DISPOSABLE) ×9 IMPLANT
HOVERMATT SINGLE USE (MISCELLANEOUS) ×3 IMPLANT
KIT BASIN OR (CUSTOM PROCEDURE TRAY) ×3 IMPLANT
MARKER SKIN DUAL TIP RULER LAB (MISCELLANEOUS) ×3 IMPLANT
NEEDLE SPNL 22GX3.5 QUINCKE BK (NEEDLE) ×3 IMPLANT
PACK UNIVERSAL I (CUSTOM PROCEDURE TRAY) ×3 IMPLANT
QUICK LOAD TK 5 (STAPLE) ×1
RELOAD STAPLER BLUE 60MM (STAPLE) ×3 IMPLANT
RELOAD STAPLER GOLD 60MM (STAPLE) ×2 IMPLANT
RELOAD STAPLER GREEN 60MM (STAPLE) ×2 IMPLANT
SCISSORS LAP 5X45 EPIX DISP (ENDOMECHANICALS) ×3 IMPLANT
SEALANT SURGICAL APPL DUAL CAN (MISCELLANEOUS) IMPLANT
SET IRRIG TUBING LAPAROSCOPIC (IRRIGATION / IRRIGATOR) ×3 IMPLANT
SHEARS HARMONIC ACE PLUS 45CM (MISCELLANEOUS) ×3 IMPLANT
SLEEVE ADV FIXATION 5X100MM (TROCAR) ×6 IMPLANT
SLEEVE GASTRECTOMY 40FR VISIGI (MISCELLANEOUS) ×3 IMPLANT
SOLUTION ANTI FOG 6CC (MISCELLANEOUS) ×3 IMPLANT
SPONGE GAUZE 2X2 STER 10/PKG (GAUZE/BANDAGES/DRESSINGS) ×2
SPONGE LAP 18X18 X RAY DECT (DISPOSABLE) ×3 IMPLANT
STAPLER ECHELON BIOABSB 60 FLE (MISCELLANEOUS) ×15 IMPLANT
STAPLER ECHELON LONG 60 440 (INSTRUMENTS) IMPLANT
STAPLER RELOAD BLUE 60MM (STAPLE) ×9
STAPLER RELOAD GOLD 60MM (STAPLE) ×6
STAPLER RELOAD GREEN 60MM (STAPLE) ×6
STRIP CLOSURE SKIN 1/2X4 (GAUZE/BANDAGES/DRESSINGS) ×2 IMPLANT
SUT MNCRL AB 4-0 PS2 18 (SUTURE) ×6 IMPLANT
SUT SURGIDAC NAB ES-9 0 48 120 (SUTURE) ×3 IMPLANT
SUT VICRYL 0 TIES 12 18 (SUTURE) ×3 IMPLANT
SYR 10ML ECCENTRIC (SYRINGE) ×3 IMPLANT
SYR 20CC LL (SYRINGE) ×3 IMPLANT
SYR 50ML LL SCALE MARK (SYRINGE) ×3 IMPLANT
TOWEL OR 17X26 10 PK STRL BLUE (TOWEL DISPOSABLE) ×3 IMPLANT
TOWEL OR NON WOVEN STRL DISP B (DISPOSABLE) ×3 IMPLANT
TRAY FOLEY W/METER SILVER 16FR (SET/KITS/TRAYS/PACK) IMPLANT
TROCAR ADV FIXATION 5X100MM (TROCAR) ×3 IMPLANT
TROCAR BLADELESS 15MM (ENDOMECHANICALS) ×3 IMPLANT
TROCAR BLADELESS OPT 5 100 (ENDOMECHANICALS) ×3 IMPLANT
TUBING CONNECTING 10 (TUBING) ×2 IMPLANT
TUBING CONNECTING 10' (TUBING) ×1
TUBING ENDO SMARTCAP (MISCELLANEOUS) ×3 IMPLANT
TUBING INSUF HEATED (TUBING) ×3 IMPLANT

## 2016-08-16 NOTE — Anesthesia Procedure Notes (Signed)
Date/Time: 08/16/2016 7:37 AM Performed by: Carleene Cooper A Pre-anesthesia Checklist: Patient identified, Emergency Drugs available, Suction available, Patient being monitored and Timeout performed Patient Re-evaluated:Patient Re-evaluated prior to inductionOxygen Delivery Method: Circle system utilized Preoxygenation: Pre-oxygenation with 100% oxygen Intubation Type: IV induction Ventilation: Mask ventilation without difficulty and Oral airway inserted - appropriate to patient size Laryngoscope Size: Mac and 4 Grade View: Grade I Tube type: Oral Tube size: 7.5 mm Number of attempts: 1 Airway Equipment and Method: Stylet Placement Confirmation: ETT inserted through vocal cords under direct vision,  positive ETCO2 and breath sounds checked- equal and bilateral Secured at: 22 cm Tube secured with: Tape Dental Injury: Teeth and Oropharynx as per pre-operative assessment

## 2016-08-16 NOTE — Transfer of Care (Signed)
Immediate Anesthesia Transfer of Care Note  Patient: Stacy Bass  Procedure(s) Performed: Procedure(s): LAPAROSCOPIC GASTRIC SLEEVE RESECTION WITH HIATAL HERNIA REPAIR, UPPER ENDO (N/A)  Patient Location: PACU  Anesthesia Type:General  Level of Consciousness: awake, alert , oriented and patient cooperative  Airway & Oxygen Therapy: Patient Spontanous Breathing and Patient connected to face mask oxygen  Post-op Assessment: Report given to RN, Post -op Vital signs reviewed and stable and Patient moving all extremities  Post vital signs: Reviewed and stable  Last Vitals:  Vitals:   08/16/16 0545  BP: (!) 157/75  Pulse: 87  Resp: 20  Temp: 36.7 C    Last Pain:  Vitals:   08/16/16 0545  TempSrc: Oral      Patients Stated Pain Goal: 4 (08/16/16 3086)  Complications: No apparent anesthesia complications

## 2016-08-16 NOTE — Anesthesia Preprocedure Evaluation (Addendum)
Anesthesia Evaluation  Patient identified by MRN, date of birth, ID band Patient awake    Reviewed: Allergy & Precautions, NPO status , Patient's Chart, lab work & pertinent test results  Airway Mallampati: I  TM Distance: >3 FB Neck ROM: Full    Dental   Pulmonary former smoker,    Pulmonary exam normal        Cardiovascular hypertension, Pt. on medications Normal cardiovascular exam     Neuro/Psych    GI/Hepatic GERD  Medicated and Controlled,  Endo/Other    Renal/GU      Musculoskeletal   Abdominal   Peds  Hematology   Anesthesia Other Findings   Reproductive/Obstetrics                             Anesthesia Physical Anesthesia Plan  ASA: III  Anesthesia Plan: General   Post-op Pain Management:    Induction: Intravenous  Airway Management Planned: Oral ETT  Additional Equipment:   Intra-op Plan:   Post-operative Plan: Extubation in OR  Informed Consent: I have reviewed the patients History and Physical, chart, labs and discussed the procedure including the risks, benefits and alternatives for the proposed anesthesia with the patient or authorized representative who has indicated his/her understanding and acceptance.     Plan Discussed with: CRNA and Surgeon  Anesthesia Plan Comments:         Anesthesia Quick Evaluation

## 2016-08-16 NOTE — Op Note (Signed)
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   Dyslipidemia   GERD (gastroesophageal reflux disease)   Hypertension    Hiatal hernia   POST-OPERATIVE DIAGNOSIS:  same  PROCEDURE:  Procedure(s): LAPAROSCOPIC SLEEVE GASTRECTOMY WITH HIATAL HERNIA REPAIR UPPER GI ENDOSCOPY  SURGEON:  Surgeon(s): Atilano Ina, MD FACS FASMBS  ASSISTANTS: Ovidio Kin FACS; Dylan Plocki - PA-S  ANESTHESIA:   general  DRAINS: none   BOUGIE: 40 fr ViSiGi  LOCAL MEDICATIONS USED:  MARCAINE + Exparel  SPECIMEN:  Source of Specimen:  Greater curvature of stomach  DISPOSITION OF SPECIMEN:  PATHOLOGY  COUNTS:  YES  INDICATION FOR PROCEDURE: This is a very pleasant 51 y.o.-year-old morbidly obese female who has had unsuccessful attempts for sustained weight loss. The patient presents today for a planned laparoscopic sleeve gastrectomy with upper endoscopy. We have discussed the risk and benefits of the procedure extensively preoperatively. Please see my separate notes.  PROCEDURE: After obtaining informed consent and receiving 5000 units of subcutaneous heparin, the patient was brought to the operating room at [facility] and placed supine on the operating room table. General endotracheal anesthesia was established. Sequential compression devices were placed. A orogastric tube was placed. The patient's abdomen was prepped and draped in the usual standard surgical fashion. The patient received preoperative IV antibiotic. A surgical timeout was performed.  Access to the abdomen was achieved using a 5 mm 0 laparoscope thru a 5 mm trocar In the left upper Quadrant 2 fingerbreadths below the left subcostal margin using the Optiview technique. Pneumoperitoneum was smoothly established up to 15 mm of mercury. The laparoscope was advanced and the abdominal cavity was surveilled. The patient was then placed in reverse Trendelenburg. There was  evidence of a hiatal hernia on laparoscopy - small gap in the left and right crus anteriorly.  A 5 mm trocar was placed slightly above and to the left of the umbilicus under direct visualization.  [facility] liver retractor was placed under the left lobe of the liver through a 5 mm trocar incision site in the subxiphoid position. A 5 mm trocar was placed in the lateral right upper quadrant along with a 15 mm trocar in the mid right abdomen. A final 5 mm trocar was placed in the lateral LUQ.  All under direct visualization. Exparel was then infiltrated in bilateral upper lateral abdominal walls using laparoscopic guidance as a tapp block  The stomach was inspected. It was completely decompressed and the orogastric tube was removed.  There was a small anterior dimple that was obviously visible. The calibration tube was placed in the oropharynx and guided down into the stomach by the CRNA. 10 mL of air was insufflated into the calibration balloon. The calibration tubing was then gently pulled back by the CRNA and it slid past the GE junction. At this point the calibration tubing was desufflated and pulled back into the esophagus. This confirmed my suspicion of a clinically significant hiatal hernia. The gastrohepatic ligament was incised with harmonic scalpel. The right crus was identified. We identified the crossing fat along the right crus. The adipose tissue just above this area was incised with harmonic scalpel. I then bluntly dissected out this area and identified the left crus. There was evidence of a hiatal hernia. I then mobilized the esophagus. The left and right crus were further mobilized with blunt dissection. I was then able to reapproximate the left and right crus  with 0 Ethibond using an Endostitch suture device and securing it with a titanium tyknot. We then had the CRNA readvanced the calibration tubing back into the stomach. 10 mL of air was insufflated into the calibration tube balloon. The  calibration tube was then gently pulled back and there was resistance at the GE junction. The tube did not slide back up into the esophagus. At this point the calibration tubing was deflated and removed from the patient's body.   We identified the pylorus and measured 6 cm proximal to the pylorus and identified an area of where we would start taking down the short gastric vessels. Harmonic scalpel was used to take down the short gastric vessels along the greater curvature of the stomach. We were able to enter the lesser sac. We continued to march along the greater curvature of the stomach taking down the short gastrics. As we approached the gastrosplenic ligament we took care in this area not to injure the spleen. We were able to take down the entire gastrosplenic ligament. We then mobilized the fundus away from the left crus of diaphragm. There were a few posterior gastric avascular attachments which were taken down. This left the stomach completely mobilized. No vessels had been taken down along the lesser curvature of the stomach.  We then reidentified the pylorus. A 40Fr ViSiGi was then placed in the oropharynx and advanced down into the stomach and placed in the distal antrum and positioned along the lesser curvature. It was placed under suction which secured the 40Fr ViSiGi in place along the lesser curve. Then using the Ethicon echelon 60 mm stapler with a green load with Seamguard, I placed a stapler along the antrum approximately 5 cm from the pylorus. The stapler was angled so that there is ample room at the angularis incisura. I then fired the first staple load after inspecting it posteriorly to ensure adequate space both anteriorly and posteriorly. At this point I still was not completely past the angularis so with another green load with Seamguard, I placed the stapler in position just inside the prior stapleline. We then rotated the stomach to insure that there was adequate anteriorly as well as  posteriorly. The stapler was then fired.. At this point I started using 60 mm gold load staple cartridges with Seamguard. The echelon stapler was then repositioned with a 60 mm gold load with Seamguard and we continued to march up along the ViSiGi. My assistant was holding traction along the greater curvature stomach along the cauterized short gastric vessels ensuring that the stomach was symmetrically retracted. Prior to each firing of the staple, we rotated the stomach to ensure that there is adequate stomach left.  As we approached the fundus, I used 60 mm blue cartridge with Seamguard aiming slightly lateral to the esophageal fat pad after mobilizing some of it off of the fundus. Although the staples on this fire had completely gone thru the last part of the stomach it had not completely cut it. Therefore 1 additional 60 blue load was used to free the remaining stomach. The sleeve was inspected. There is no evidence of cork screw. The staple line appeared hemostatic. The CRNA inflated the ViSiGi to the green zone and the upper abdomen was flooded with saline. There were no bubbles. The sleeve was decompressed and the ViSiGi removed. My assistant scrubbed out and performed an upper endoscopy. The sleeve easily distended with air and the scope was easily advanced to the pylorus. There is no evidence of  internal bleeding or cork screwing. There was no narrowing at the angularis. There is no evidence of bubbles. Please see his operative note for further details. The gastric sleeve was decompressed and the endoscope was removed.  The greater curvature the stomach was grasped with a laparoscopic grasper and removed from the 15 mm trocar site.  The liver retractor was removed. I then closed the 15 mm trocar site with 1 interrupted 0 Vicryl sutures through the fascia using the endoclose. The closure was viewed laparoscopically and it was airtight. Remaining exparel was then infiltrated in the preperitoneal spaces around  the trocar sites. Pneumoperitoneum was released. All trocar sites were closed with a 4-0 Monocryl in a subcuticular fashion followed by the application of benzoin, steri-strips, bandages. The patient was extubated and taken to the recovery room in stable condition. All needle, instrument, and sponge counts were correct x2. There are no immediate complications  (2) 60 mm green with Seamguard (2) 60 mm gold with seamguard (2) 60 mm blue with 1 seamguard  PLAN OF CARE: Admit to inpatient   PATIENT DISPOSITION:  PACU - hemodynamically stable.   Delay start of Pharmacological VTE agent (>24hrs) due to surgical blood loss or risk of bleeding:  no  Mary Sella. [name], MD, FACS FASMBS General, Bariatric, & Minimally Invasive Surgery [facility], [place]

## 2016-08-16 NOTE — H&P (View-Only) (Signed)
Stacy Bass 08/10/2016 12:00 PM Location: [facility] Patient #: [id number] DOB: 1965-12-13 Married / Language: English / Race: White Female  History of Present Illness Stacy Bass M. [name] MD; 08/11/2016 1:02 PM) The patient is a 51 year old female who presents for a preoperative evaluation. She comes in today for her preoperative evaluation. She denies any medical changes since she was initially seen in January. Her initial visit weight was 307 pounds. She denies any trips the emergency room. She denies any chest pain, chest pressure, shortness of breath, dyspnea on exertion, orthopnea. She does state reflux medication on a daily basis. She denies any melena or hematochezia. She denies any dysuria. She denies any TIAs or amaurosis fugax. She saw to nutrition and psychology both of which cleared her for surgery. She also stop smoking. Her urine nicotine was negative. She went today for another urine nicotine test. Her upper GI showed a transient small hiatal hernia with mild reflux. Her bariatric evaluation labs were unremarkable except for a vitamin D level of 29. She takes 50,000 units of vitamin D once a week.  Review of systems-conference a 12 point review systems was performed and all systems are negative a consent for what is mentioned in the HPI   Problem List/Past Medical Stacy Bass M. [name], MD; 08/11/2016 1:04 PM) PRE-OP TESTING (Z01.818) HIATAL HERNIA (K44.9) MORBID OBESITY WITH BMI OF 45.0-49.9, ADULT (E66.01)  Past Surgical History Stacy Bass M. [name], MD; 08/11/2016 1:04 PM) Hysterectomy (not due to cancer) - Partial Knee Surgery Bilateral.  Diagnostic Studies History Stacy Bass M. [name], MD; 08/11/2016 1:04 PM) Colonoscopy 1-5 years ago Mammogram within last year Pap Smear 1-5 years ago  Allergies Stacy Bass M. [name], MD; 08/11/2016 1:04 PM) No Known Drug Allergies 05/31/2016  Medication History Stacy Bass M. [name], MD; 08/11/2016 1:04 PM) Medications  Reconciled OxyCODONE HCl ( [name] Solution, 5-10 Milliliter Oral every four hours, as needed, Taken starting 08/10/2016) Active. Zofran ODT (  Tablet Disint, 1 (one) Tablet Disperse Oral every six hours, as needed, Taken starting 08/10/2016) Active. Losartan Potassium (  Tablet, Oral) Active. Meloxicam (  Tablet, Oral) Active. Triamterene-HCTZ (37.5-25MG  Tablet, Oral) Active. TiZANidine HCl (  Tablet, Oral) Active. Vitamin D (Ergocalciferol) (50000UNIT Capsule, Oral) Active. RaNITidine HCl (  Capsule, Oral) Active. Benadryl Allergy (  Capsule, Oral) Active. Tylenol (  Capsule, Oral) Active.  Social History Stacy Bass M. [name], MD; 08/11/2016 1:04 PM) Alcohol use Occasional alcohol use. Caffeine use Coffee. No drug use Tobacco use Current every day smoker.  Family History Stacy Bass M. [name], MD; 08/11/2016 1:04 PM) Anesthetic complications Family Members In General. Arthritis Family Members In General, Father. Cervical Cancer Mother. Colon Cancer Family Members In General. Colon Polyps Father. Depression Mother, Sister. Diabetes Mellitus Mother. Heart disease in female family member before age 44 Hypertension Family Members In General, Father, Mother. Melanoma Father. Migraine Headache Mother.  Pregnancy / Birth History Stacy Bass M. [name], MD; 08/11/2016 1:04 PM) Age at menarche 11 years. Contraceptive History Oral contraceptives. Gravida 6 Maternal age 22-30 Para 1  Other Problems Stacy Sella. [name], MD; 08/11/2016 1:04 PM) Arthritis Hemorrhoids Migraine Headache HYPERCHOLESTEREMIA (E78.00) ESSENTIAL HYPERTENSION (I10) BACK PAIN, LUMBOSACRAL (M54.5) GASTROESOPHAGEAL REFLUX DISEASE, ESOPHAGITIS PRESENCE NOT SPECIFIED (K21.9)     Review of Systems Stacy Bass M. Sisto Granillo MD; 08/11/2016 1:03 PM) General Present- Night Sweats. Not Present- Appetite Loss, Chills, Fatigue, Fever, Weight Gain and Weight Loss. Skin Not Present- Change in  Wart/Mole, Dryness, Hives, Jaundice, New Lesions, Non-Healing Wounds, Rash and Ulcer. HEENT Present- Ringing in the Ears and Wears glasses/contact lenses. Not Present- Earache,  Hearing Loss, Hoarseness, Nose Bleed, Oral Ulcers, Seasonal Allergies, Sinus Pain, Sore Throat, Visual Disturbances and Yellow Eyes. Respiratory Present- Snoring. Not Present- Bloody sputum, Chronic Cough, Difficulty Breathing and Wheezing. Breast Not Present- Breast Mass, Breast Pain, Nipple Discharge and Skin Changes. Cardiovascular Not Present- Chest Pain, Difficulty Breathing Lying Down, Leg Cramps, Palpitations, Rapid Heart Rate, Shortness of Breath and Swelling of Extremities. Gastrointestinal Present- Indigestion. Not Present- Abdominal Pain, Bloating, Bloody Stool, Change in Bowel Habits, Chronic diarrhea, Constipation, Difficulty Swallowing, Excessive gas, Gets full quickly at meals, Hemorrhoids, Nausea, Rectal Pain and Vomiting. Female Genitourinary Not Present- Frequency, Nocturia, Painful Urination, Pelvic Pain and Urgency. Musculoskeletal Present- Back Pain, Joint Pain, Joint Stiffness and Swelling of Extremities. Not Present- Muscle Pain and Muscle Weakness. Neurological Present- Numbness and Tingling. Not Present- Decreased Memory, Fainting, Headaches, Seizures, Tremor, Trouble walking and Weakness. Psychiatric Not Present- Anxiety, Bipolar, Change in Sleep Pattern, Depression, Fearful and Frequent crying. Endocrine Present- Hot flashes. Not Present- Cold Intolerance, Excessive Hunger, Hair Changes, Heat Intolerance and New Diabetes. Hematology Not Present- Blood Thinners, Easy Bruising, Excessive bleeding, Gland problems, HIV and Persistent Infections.  Vitals (Alisha Spillers CMA; 08/10/2016 11:43 AM) 08/10/2016 11:43 AM Weight: 305 lb Height: 66in Body Surface Area: 2.39 m Body Mass Index: 49.23 kg/m  Temp.: 98.64F(Oral)  Pulse: 86 (Regular)  BP: 128/82 (Sitting, Left Arm,  Standard)      Physical Exam Stacy Bass M. Ludell Zacarias MD; 08/11/2016 1:00 PM)  General Mental Status-Alert. General Appearance-Consistent with stated age. Hydration-Well hydrated. Voice-Normal. Note: central truncal obesity  Head and Neck Head-normocephalic, atraumatic with no lesions or palpable masses. Trachea-midline. Thyroid Gland Characteristics - normal size and consistency.  Eye Eyeball - Bilateral-Extraocular movements intact. Sclera/Conjunctiva - Bilateral-No scleral icterus.  Chest and Lung Exam Chest and lung exam reveals -quiet, even and easy respiratory effort with no use of accessory muscles and on auscultation, normal breath sounds, no adventitious sounds and normal vocal resonance. Inspection Chest Wall - Normal. Back - normal.  Breast - Did not examine.  Cardiovascular Cardiovascular examination reveals -normal heart sounds, regular rate and rhythm with no murmurs and normal pedal pulses bilaterally.  Abdomen Inspection Inspection of the abdomen reveals - No Hernias. Skin - Scar - no surgical scars. Palpation/Percussion Palpation and Percussion of the abdomen reveal - Soft, Non Tender, No Rebound tenderness, No Rigidity (guarding) and No hepatosplenomegaly. Auscultation Auscultation of the abdomen reveals - Bowel sounds normal.  Peripheral Vascular Upper Extremity Palpation - Pulses bilaterally normal.  Neurologic Neurologic evaluation reveals -alert and oriented x 3 with no impairment of recent or remote memory. Mental Status-Normal.  Neuropsychiatric The patient's mood and affect are described as -normal. Judgment and Insight-insight is appropriate concerning matters relevant to self.  Musculoskeletal Normal Exam - Left-Upper Extremity Strength Normal and Lower Extremity Strength Normal. Normal Exam - Right-Upper Extremity Strength Normal and Lower Extremity Strength Normal.  Lymphatic Head & Neck  General Head &  Neck Lymphatics: Bilateral - Description - Normal. Axillary - Did not examine. Femoral & Inguinal - Did not examine.    Assessment & Plan Stacy Bass M. Burech Mcfarland MD; 08/11/2016 1:04 PM)  MORBID OBESITY WITH BMI OF 45.0-49.9, ADULT (E66.01) Impression: I congratulated her on her smoking cessation. We discussed the importance of the preoperative meal plan. We rediscussed the typical postoperative course and recovery. We discussed the findings of a small hiatal hernia possibly on her upper GI. I explained that I would test for one intraoperatively. If found to have a clinically significant 1 we discussed what  repair would involve. All of her questions were asked and answered. She was given her postoperative prescriptions today.  Current Plans Pt Education - EMW_preopbariatric ESSENTIAL HYPERTENSION (I10)  HYPERCHOLESTEREMIA (E78.00)  BACK PAIN, LUMBOSACRAL (M54.5)  GASTROESOPHAGEAL REFLUX DISEASE, ESOPHAGITIS PRESENCE NOT SPECIFIED (K21.9) Impression: we discussed that reflux may actually worsen with sleeve gastrectomy.  HIATAL HERNIA (K44.9) Impression: See above  Stacy Sella. [name], MD, FACS General, Bariatric, & Minimally Invasive Surgery [facility], [place]

## 2016-08-16 NOTE — Discharge Instructions (Signed)

## 2016-08-16 NOTE — Interval H&P Note (Signed)
History and Physical Interval Note:  08/16/2016 7:25 AM  [name]  has presented today for surgery, with the diagnosis of Morbid Obesity, Hypercholesterolemia, HTN, GERD, Hiatal Hernia  The various methods of treatment have been discussed with the patient and family. After consideration of risks, benefits and other options for treatment, the patient has consented to  Procedure(s): LAPAROSCOPIC GASTRIC SLEEVE RESECTION WITH HIATAL HERNIA REPAIR, UPPER ENDO (N/A) as a surgical intervention .  The patient's history has been reviewed, patient examined, no change in status, stable for surgery.  I have reviewed the patient's chart and labs.  Questions were answered to the patient's satisfaction.    Stacy Bass. [name], MD, FACS General, Bariatric, & Minimally Invasive Surgery [facility], [place]  [facility] M

## 2016-08-17 ENCOUNTER — Encounter (HOSPITAL_COMMUNITY): Payer: Self-pay | Admitting: General Surgery

## 2016-08-17 LAB — CBC WITH DIFFERENTIAL/PLATELET
BASOS PCT: 0 %
Basophils Absolute: 0 10*3/uL (ref 0.0–0.1)
EOS ABS: 0 10*3/uL (ref 0.0–0.7)
Eosinophils Relative: 0 %
HCT: 41.2 % (ref 36.0–46.0)
Hemoglobin: 14.1 g/dL (ref 12.0–15.0)
Lymphocytes Relative: 14 %
Lymphs Abs: 1.6 10*3/uL (ref 0.7–4.0)
MCH: 28.9 pg (ref 26.0–34.0)
MCHC: 34.2 g/dL (ref 30.0–36.0)
MCV: 84.4 fL (ref 78.0–100.0)
MONO ABS: 0.8 10*3/uL (ref 0.1–1.0)
MONOS PCT: 6 %
Neutro Abs: 9.7 10*3/uL — ABNORMAL HIGH (ref 1.7–7.7)
Neutrophils Relative %: 80 %
Platelets: 138 10*3/uL — ABNORMAL LOW (ref 150–400)
RBC: 4.88 MIL/uL (ref 3.87–5.11)
RDW: 14.5 % (ref 11.5–15.5)
WBC: 12.1 10*3/uL — ABNORMAL HIGH (ref 4.0–10.5)

## 2016-08-17 LAB — COMPREHENSIVE METABOLIC PANEL
ALBUMIN: 3.6 g/dL (ref 3.5–5.0)
ALT: 89 U/L — ABNORMAL HIGH (ref 14–54)
ANION GAP: 9 (ref 5–15)
AST: 77 U/L — ABNORMAL HIGH (ref 15–41)
Alkaline Phosphatase: 56 U/L (ref 38–126)
BILIRUBIN TOTAL: 0.4 mg/dL (ref 0.3–1.2)
BUN: 14 mg/dL (ref 6–20)
CALCIUM: 8.7 mg/dL — AB (ref 8.9–10.3)
CO2: 22 mmol/L (ref 22–32)
CREATININE: 0.94 mg/dL (ref 0.44–1.00)
Chloride: 102 mmol/L (ref 101–111)
GFR calc Af Amer: >60 mL/min (ref >60)
GFR calc non Af Amer: >60 mL/min (ref >60)
Glucose, Bld: 121 mg/dL — ABNORMAL HIGH (ref 65–99)
POTASSIUM: 4.7 mmol/L (ref 3.5–5.1)
SODIUM: 133 mmol/L — AB (ref 135–145)
Total Protein: 6.9 g/dL (ref 6.5–8.1)

## 2016-08-17 MED ORDER — OXYCODONE HCL 5 MG/5ML PO SOLN: 5.0000 mg | ORAL | 0 refills | Status: DC | PRN

## 2016-08-17 NOTE — Progress Notes (Signed)
Patient alert and oriented, pain is controlled. Patient is tolerating fluids, advanced to protein shake today, patient is tolerating well.  Reviewed Gastric sleeve discharge instructions with patient and patient is able to articulate understanding.  Provided information on BELT program, Support Group and [facility] outpatient pharmacy. All questions answered, will continue to monitor.  Dom Haverland RN  

## 2016-08-17 NOTE — Anesthesia Postprocedure Evaluation (Addendum)
Anesthesia Post Note  Patient: Stacy Bass  Procedure(s) Performed: Procedure(s) (LRB): LAPAROSCOPIC GASTRIC SLEEVE RESECTION WITH HIATAL HERNIA REPAIR, UPPER ENDO (N/A)  Patient location during evaluation: PACU Anesthesia Type: General Level of consciousness: awake Pain management: pain level controlled Vital Signs Assessment: post-procedure vital signs reviewed and stable Respiratory status: spontaneous breathing Cardiovascular status: stable Postop Assessment: no signs of nausea or vomiting Anesthetic complications: no        Last Vitals:  Vitals:   08/17/16 0102 08/17/16 0516  BP: (!) 143/68 (!) 138/59  Pulse: 71 68  Resp: 18 18  Temp: 37.4 C 37.3 C    Last Pain:  Vitals:   08/17/16 0900  TempSrc:   PainSc: 3    Pain Goal: Patients Stated Pain Goal: 4 (08/17/16 0700)               Shevelle Smither JR,JOHN Bluma Buresh

## 2016-08-17 NOTE — Progress Notes (Signed)
Patient ID: Stacy Bass, female   DOB: 1965-08-21, 51 y.o.   MRN: [id number]  Progress Note: Metabolic and Bariatric Surgery Service   Subjective: Intermittent bouts of nausea; but did well with water. Some upper abd discomfort.  Ambulated multiple times  Objective: Vital signs in last 24 hours: Temp:  [97.5 F (36.4 C)-99.3 F (37.4 C)] 99.2 F (37.3 C) (04/04 0516) Pulse Rate:  [56-90] 68 (04/04 0516) Resp:  [13-19] 18 (04/04 0516) BP: (132-191)/(54-102) 138/59 (04/04 0516) SpO2:  [87 %-100 %] 100 % (04/04 0516)    Intake/Output from previous day: 04/03 0701 - 04/04 0700 In: 5162.9 [P.O.:2540; I.V.:2622.9] Out: 1075 [Urine:1050; Blood:25] Intake/Output this shift: No intake/output data recorded.  Lungs: cta, symmetric rish  Cardiovascular: reg, not tachy  Abd: soft, obese, incisions c/d/I; mild approp TTP  Extremities: no edema  Neuro: approp, alert, nad  Lab Results: CBC   Recent Labs  08/16/16 1022 08/17/16 0456  WBC  --  12.1*  HGB 14.4 14.1  HCT 43.2 41.2  PLT  --  138*   BMET  Recent Labs  08/17/16 0456  NA 133*  K 4.7  CL 102  CO2 22  GLUCOSE 121*  BUN 14  CREATININE 0.94  CALCIUM 8.7*   PT/INR No results for input(s): LABPROT, INR in the last 72 hours. ABG No results for input(s): PHART, HCO3 in the last 72 hours.  Invalid input(s): PCO2, PO2  Studies/Results:  Anti-infectives: Anti-infectives    Start     Dose/Rate Route Frequency Ordered Stop   08/16/16 0622  cefoTEtan in Dextrose 5% (CEFOTAN) 2-2.08 GM-% IVPB    Comments:  [place], Wylene Men   : cabinet override      08/16/16 0622 08/16/16 1829   08/16/16 0609  cefoTEtan in Dextrose 5% (CEFOTAN) IVPB 2 g     2 g Intravenous On call to O.R. 08/16/16 4098 08/16/16 0738      Medications: Scheduled Meds: . enoxaparin (LOVENOX) injection  30 mg Subcutaneous Q12H  . pantoprazole (PROTONIX) IV  40 mg Intravenous QHS  . [START ON 08/18/2016] protein supplement shake  2 oz Oral Q2H    Continuous Infusions: . dextrose 5 % and 0.45 % NaCl with KCl 20 mEq/L 125 mL/hr at 08/17/16 0704   PRN Meds:.oxyCODONE **AND** acetaminophen, acetaminophen, diphenhydrAMINE, enalaprilat, fentaNYL (SUBLIMAZE) injection, ondansetron (ZOFRAN) IV, promethazine  Assessment/Plan: Patient Active Problem List   Diagnosis Date Noted  . 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 Hiatal hernia 08/16/2016  . Obesity, Class III, BMI 40-49.9 (morbid obesity) (HCC) 08/16/2016  . Allergic rhinitis 02/05/2014  . Arthritis involving multiple sites 02/05/2014  . Dyslipidemia 02/05/2014  . GERD (gastroesophageal reflux disease) 02/05/2014  . Hypertension 02/05/2014  . Migraines 02/05/2014  . Vitamin D deficiency 02/05/2014   s/p Procedure(s): LAPAROSCOPIC GASTRIC SLEEVE RESECTION WITH HIATAL HERNIA REPAIR, UPPER ENDO 08/16/2016  Principal Problem:   Obesity, Class III, BMI 40-49.9 (morbid obesity) (HCC) Active Problems:   Dyslipidemia   GERD (gastroesophageal reflux disease)   Hypertension   S/P laparoscopic sleeve gastrectomy   Hiatal hernia  Doing well No signs of leak Cont diet as tolerated Start dc teaching ambualte, pulmonary toilet  Disposition:  LOS: 1 day  The patient should be discharged from the hospital today  Atilano Ina, MD [phone] [facility]

## 2016-08-17 NOTE — Plan of Care (Signed)
Problem: Food- and Nutrition-Related Knowledge Deficit (NB-1.1) Goal: Nutrition education Formal process to instruct or train a patient/client in a skill or to impart knowledge to help patients/clients voluntarily manage or modify food choices and eating behavior to maintain or improve health. Outcome: Completed/Met Date Met: 08/17/16 Nutrition Education Note  Received consult for diet education per DROP protocol.   Discussed 2 week post op diet with pt. Emphasized that liquids must be non carbonated, non caffeinated, and sugar free. Fluid goals discussed. Pt to follow up with outpatient bariatric RD for further diet progression after 2 weeks. Multivitamins and minerals also reviewed. Teach back method used, pt expressed understanding, expect good compliance.   Diet: First 2 Weeks  You will see the nutritionist about two (2) weeks after your surgery. The nutritionist will increase the types of foods you can eat if you are handling liquids well:  If you have severe vomiting or nausea and cannot handle clear liquids lasting longer than 1 day, call your surgeon  Protein Shake  Drink at least 2 ounces of shake 5-6 times per day  Each serving of protein shakes (usually 8 - 12 ounces) should have a minimum of:  15 grams of protein  And no more than 5 grams of carbohydrate  Goal for protein each day:  Men = 80 grams per day  Women = 60 grams per day  Protein powder may be added to fluids such as non-fat milk or Lactaid milk or Soy milk (limit to 35 grams added protein powder per serving)   Hydration  Slowly increase the amount of water and other clear liquids as tolerated (See Acceptable Fluids)  Slowly increase the amount of protein shake as tolerated  Sip fluids slowly and throughout the day  May use sugar substitutes in small amounts (no more than 6 - 8 packets per day; i.e. Splenda)   Fluid Goal  The first goal is to drink at least 8 ounces of protein shake/drink per day (or as directed  by the nutritionist); some examples of protein shakes are Premier Protein, Johnson [employer] Johnson, [employer]R Corporation, EAS Edge HP, and Unjury. See handout from pre-op Bariatric Education Class:  Slowly increase the amount of protein shake you drink as tolerated  You may find it easier to slowly sip shakes throughout the day  It is important to get your proteins in first  Your fluid goal is to drink 64 - 100 ounces of fluid daily  It may take a few weeks to build up to this  32 oz (or more) should be clear liquids  And  32 oz (or more) should be full liquids (see below for examples)  Liquids should not contain sugar, caffeine, or carbonation   Clear Liquids:  Water or Sugar-free flavored water (i.e. Fruit H2O, Propel)  Decaffeinated coffee or tea (sugar-free)  Crystal Lite, Wyler's Lite, Minute Maid Lite  Sugar-free Jell-O  Bouillon or broth  Sugar-free Popsicle: *Less than 20 calories each; Limit 1 per day   Full Liquids:  Protein Shakes/Drinks + 2 choices per day of other full liquids  Full liquids must be:  No More Than 12 grams of Carbs per serving  No More Than 3 grams of Fat per serving  Strained low-fat cream soup  Non-Fat milk  Fat-free Lactaid Milk  Sugar-free yogurt (Dannon Lite & Fit, [place] yogurt, Oikos Zero)   Clayton Bibles, MS, RD, LDN Pager: [phone] After Hours Pager: [phone]

## 2016-08-24 NOTE — Discharge Summary (Signed)
Physician Discharge Summary  Stacy Bass XBM:[id number] DOB: [birth year] DOA: 08/16/2016  PCP: Sofie Hartigan, MD  Admit date: 08/16/2016 Discharge date: 08/17/2016  Recommendations for Outpatient Follow-up:  1.   Follow-up Information    Gayland Curry, MD Follow up on 09/07/2016.   Specialty:  General Surgery Why:  POST OP FOLLOW UP APPOINTMENT AT 11:45AM  Contact information: [address] [place] [address] [phone]        Gayland Curry, MD Follow up.   Specialty:  General Surgery Contact information: [place] [address] [phone]          Discharge Diagnoses:  Principal Problem:   Obesity, Class III, BMI 40-49.9 (morbid obesity) ([place]) Active Problems:   Dyslipidemia   GERD (gastroesophageal reflux disease)   Hypertension   S/P laparoscopic sleeve gastrectomy   Hiatal hernia   Surgical Procedure: Laparoscopic Sleeve Gastrectomy with hiatal hernia repair, upper endoscopy  Discharge Condition: Good Disposition: Home  Diet recommendation: Postoperative sleeve gastrectomy diet (liquids only)  Filed Weights   08/16/16 0614  Weight: 135.4 kg (298 lb 9.6 oz)     Hospital Course:  The patient was admitted for a planned laparoscopic sleeve gastrectomy. Please see operative note. Preoperatively the patient was given 5000 units of subcutaneous heparin for DVT prophylaxis. Postoperative prophylactic Lovenox dosing was started on the morning of postoperative day 1. On the evening of postoperative day 0, the patient was started on water and ice chips. On postoperative day 1 the patient had no fever or tachycardia and was tolerating water & their diet was gradually advanced throughout the day. The patient was ambulating without difficulty. Their vital signs are stable without fever or tachycardia. Their hemoglobin had remained stable. The patient had received discharge instructions and counseling. They were deemed stable for  discharge and had met discharge criteria   Discharge Instructions  Discharge Instructions    Ambulate hourly while awake    Complete by:  As directed    Call MD for:  difficulty breathing, headache or visual disturbances    Complete by:  As directed    Call MD for:  persistant dizziness or light-headedness    Complete by:  As directed    Call MD for:  persistant nausea and vomiting    Complete by:  As directed    Call MD for:  redness, tenderness, or signs of infection (pain, swelling, redness, odor or green/yellow discharge around incision site)    Complete by:  As directed    Call MD for:  severe uncontrolled pain    Complete by:  As directed    Call MD for:  temperature >101 F    Complete by:  As directed    Diet bariatric full liquid    Complete by:  As directed    Discharge instructions    Complete by:  As directed    See bariatric discharge instructions   Incentive spirometry    Complete by:  As directed    Perform hourly while awake     Allergies as of 08/17/2016      Reactions   Morphine And Related Itching      Medication List    STOP taking these medications   meloxicam 15 MG tablet Commonly known as:  MOBIC   triamterene-hydrochlorothiazide 37.5-25 MG tablet Commonly known as:  MAXZIDE-25     TAKE these medications   losartan 100 MG tablet Commonly known as:  COZAAR Take 100  mg by mouth every morning. Notes to patient:  Monitor Blood Pressure Daily and keep a log for primary care physician.  You may need to make changes to your medications with rapid weight loss.     oxyCODONE 5 MG/5ML solution Commonly known as:  ROXICODONE Take 5-10 mLs (5-10 mg total) by mouth every 4 (four) hours as needed for moderate pain or severe pain.   ranitidine 150 MG tablet Commonly known as:  ZANTAC Take 150 mg by mouth 2 (two) times daily as needed for heartburn.   tiZANidine 4 MG tablet Commonly known as:  ZANAFLEX Take 4 mg by mouth every 8 (eight) hours as needed  for muscle spasms.   Vitamin D (Ergocalciferol) 50000 units Caps capsule Commonly known as:  DRISDOL Take 50,000 Units by mouth every 7 (seven) days. On Thursdays      Follow-up Information    Gayland Curry, MD Follow up on 09/07/2016.   Specialty:  General Surgery Why:  POST OP FOLLOW UP APPOINTMENT AT 11:45AM  Contact information: [address] [place] [address] [phone]        Gayland Curry, MD Follow up.   Specialty:  General Surgery Contact information: [place] [address] [phone]            The results of significant diagnostics from this hospitalization (including imaging, microbiology, ancillary and laboratory) are listed below for reference.    Significant Diagnostic Studies: No results found.  Labs: BMET    Component Value Date/Time   NA 133 (L) 08/17/2016 0456   K 4.7 08/17/2016 0456   CL 102 08/17/2016 0456   CO2 22 08/17/2016 0456   GLUCOSE 121 (H) 08/17/2016 0456   BUN 14 08/17/2016 0456   CREATININE 0.94 08/17/2016 0456   CALCIUM 8.7 (L) 08/17/2016 0456   GFRNONAA >60 08/17/2016 0456   GFRAA >60 08/17/2016 0456   Hepatic Function Latest Ref Rng & Units 08/17/2016 08/16/2016  Total Protein 6.5 - 8.1 g/dL 6.9 7.0  Albumin 3.5 - 5.0 g/dL 3.6 3.9  AST 15 - 41 U/L 77(H) 19  ALT 14 - 54 U/L 89(H) 22  Alk Phosphatase 38 - 126 U/L 56 63  Total Bilirubin 0.3 - 1.2 mg/dL 0.4 0.5  Bilirubin, Direct 0.1 - 0.5 mg/dL - <0.1(L)   CBC Latest Ref Rng & Units 08/17/2016 08/16/2016 08/10/2016  WBC 4.0 - 10.5 K/uL 12.1(H) - 7.2  Hemoglobin 12.0 - 15.0 g/dL 14.1 14.4 14.2  Hematocrit 36.0 - 46.0 % 41.2 43.2 42.4  Platelets 150 - 400 K/uL 138(L) - 215    Principal Problem:   Obesity, Class III, BMI 40-49.9 (morbid obesity) ([facility]) Active Problems:   Dyslipidemia   GERD (gastroesophageal reflux disease)   Hypertension   S/P laparoscopic sleeve gastrectomy   Hiatal hernia   Time coordinating discharge: 15  min  Signed:  Gayland Curry, MD [facility] [place] 08/24/2016, 3:50 PM

## 2016-08-25 ENCOUNTER — Telehealth (HOSPITAL_COMMUNITY): Payer: Self-pay

## 2016-08-25 NOTE — Telephone Encounter (Signed)
Made discharge phone call to patient.. Asking the following questions.    1. Do you have someone to care for you now that you are home?  independent 2. Are you having pain now that is not relieved by your pain medication?  Doesn't need pain medication now 3. Are you able to drink the recommended daily amount of fluids (48 ounces minimum/day) and protein (60-80 grams/day) as prescribed by the dietitian or nutritional counselor?  64 ounces of water 60 grams of protein 4. Are you taking the vitamins and minerals as prescribed?  No problems with vitamins and minerals 5. Do you have the "on call" number to contact your surgeon if you have a problem or question?   6. Are your incisions free of redness, swelling or drainage? (If steri strips, address that these can fall off, shower as tolerated) no problems 7. Have your bowels moved since your surgery?  If not, are you passing gas?  Had bowel movements 8. Are you up and walking 3-4 times per day?  yes 9. Were you provided your discharge medications before your surgery or before you were discharged from the hospital and are you taking them without problem?  No problem with meds

## 2016-08-30 ENCOUNTER — Encounter: Payer: BC Managed Care – PPO | Attending: General Surgery | Admitting: Skilled Nursing Facility1

## 2016-08-30 DIAGNOSIS — Z6841 Body Mass Index (BMI) 40.0 and over, adult: Secondary | ICD-10-CM | POA: Insufficient documentation

## 2016-08-30 DIAGNOSIS — I10 Essential (primary) hypertension: Secondary | ICD-10-CM | POA: Insufficient documentation

## 2016-08-30 DIAGNOSIS — Z713 Dietary counseling and surveillance: Secondary | ICD-10-CM | POA: Diagnosis present

## 2016-08-30 DIAGNOSIS — E7800 Pure hypercholesterolemia, unspecified: Secondary | ICD-10-CM | POA: Diagnosis not present

## 2016-08-30 DIAGNOSIS — E669 Obesity, unspecified: Secondary | ICD-10-CM

## 2016-08-31 ENCOUNTER — Encounter: Payer: Self-pay | Admitting: Skilled Nursing Facility1

## 2016-08-31 NOTE — Progress Notes (Signed)
Bariatric Class:  Appt start time: 1530 end time:  1630.  2 Week Post-Operative Nutrition Class  Patient was seen on 08/30/2016 for Post-Operative Nutrition education [facility].   Surgery date: 08/16/2016 Surgery type: Sleeve Gastrectomy  Start weight at [facility]: 313.3 Weight today: 286.4 Weight change: 26.9  TANITA  BODY COMP RESULTS  08/30/2016   BMI (kg/m^2) 44.9   Fat Mass (lbs) 151.2   Fat Free Mass (lbs) 135.2   Total Body Water (lbs) 100.2   The following the learning objectives were met by the patient during this course:  Identifies Phase 3A (Soft, High Proteins) Dietary Goals and will begin from 2 weeks post-operatively to 2 months post-operatively  Identifies appropriate sources of fluids and proteins   States protein recommendations and appropriate sources post-operatively  Identifies the need for appropriate texture modifications, mastication, and bite sizes when consuming solids  Identifies appropriate multivitamin and calcium sources post-operatively  Describes the need for physical activity post-operatively and will follow MD recommendations  States when to call healthcare provider regarding medication questions or post-operative complications  Handouts given during class include:  Phase 3A: Soft, High Protein Diet Handout  Follow-Up Plan: Patient will follow-up at [facility] in 6 weeks for 2 month post-op nutrition visit for diet advancement per MD.

## 2016-10-13 ENCOUNTER — Encounter: Payer: Self-pay | Admitting: Skilled Nursing Facility1

## 2016-10-13 ENCOUNTER — Encounter: Payer: BC Managed Care – PPO | Attending: General Surgery | Admitting: Skilled Nursing Facility1

## 2016-10-13 DIAGNOSIS — Z713 Dietary counseling and surveillance: Secondary | ICD-10-CM | POA: Diagnosis present

## 2016-10-13 DIAGNOSIS — Z6841 Body Mass Index (BMI) 40.0 and over, adult: Secondary | ICD-10-CM | POA: Diagnosis not present

## 2016-10-13 DIAGNOSIS — I10 Essential (primary) hypertension: Secondary | ICD-10-CM | POA: Insufficient documentation

## 2016-10-13 DIAGNOSIS — E7800 Pure hypercholesterolemia, unspecified: Secondary | ICD-10-CM | POA: Insufficient documentation

## 2016-10-13 NOTE — Patient Instructions (Addendum)
-  Try your best to get at least 64 fluid ounces every day  -Take adding vegetables very slow: eat your protein first   -Try to eat at least 3 meals and 1 snack every day  -Try to be intentional about your movement

## 2016-10-13 NOTE — Progress Notes (Signed)
  Primary concerns today: Post-operative Bariatric Surgery Nutrition Management.  Pt states she struggled with not being able to get food down due to constipation. Pt states she does not feel hunger. Pt states she has been eating buffalo dip with cream cheese.   Surgery date: 08/16/2016 Surgery type: Sleeve Gastrectomy  [facility] weight at [facility]: 313.3 Weight today: 262 Weight change: 24.4  TANITA  BODY COMP RESULTS  08/30/2016 10/13/2016   BMI (kg/m^2) 44.9 40.4   Fat Mass (lbs) 151.2 122   Fat Free Mass (lbs) 135.2 140   Total Body Water (lbs) 100.2 102.6    24-hr recall: B (AM): boiled egg (7g) Snk (AM):  L (PM): protein bar or shake (15g) Snk (PM): protein bar (15g) D (PM): chicken---tuna salad or chicken salad  Snk (PM):   Fluid intake: crystal light : at least 50 ounces Estimated total protein intake: 58g  Medications: see list Supplementation: calcium and bariatric advantage   Using straws: no Drinking while eating: no Having you been chewing well:no Chewing/swallowing difficulties: no Changes in vision: no Changes to mood/headaches: no Hair loss/Cahnges to skin/Changes to nails: no Any difficulty focusing or concentrating: no Sweating: no Dizziness/Lightheaded: no Palpitations: no  Carbonated beverages: no N/V/D/C/GAS: needing stool softner, a lot of burping  Abdominal Pain: no Dumping syndrome: no  Recent physical activity:  ADL's  Progress Towards Goal(s):  In progress.  Handouts given during visit include:  Ns veggies + protein    Nutritional Diagnosis:  [name]-3.3 Overweight/obesity related to past poor dietary habits and physical inactivity as evidenced by patient w/ recent sleeve gastrectomy surgery following dietary guidelines for continued weight loss.    Intervention:  Nutrition counseling. Dietitian educated the pt on the advancement of her diet to include ns veggies. Goals: -Try your best to get at least 64 fluid ounces every day -Take adding  vegetables very slow: eat your protein first  -Try to eat at least 3 meals and 1 snack every day -Try to be intentional about your movement   Teaching Method Utilized:  Visual Auditory Hands on Barriers to learning/adherence to lifestyle change: none identified   Demonstrated degree of understanding via:  Teach Back   Monitoring/Evaluation:  Dietary intake, exercise, lap band fills, and body weight.

## 2016-10-21 NOTE — Addendum Note (Signed)
Addendum  created 10/21/16 1136 by Deloris Mittag, MD   Sign clinical note    

## 2017-12-08 ENCOUNTER — Other Ambulatory Visit: Payer: Self-pay | Admitting: Family Medicine

## 2017-12-08 DIAGNOSIS — Z1231 Encounter for screening mammogram for malignant neoplasm of breast: Secondary | ICD-10-CM

## 2019-04-08 ENCOUNTER — Other Ambulatory Visit: Payer: Self-pay

## 2019-04-08 DIAGNOSIS — Z20822 Contact with and (suspected) exposure to covid-19: Secondary | ICD-10-CM

## 2019-04-10 LAB — NOVEL CORONAVIRUS, NAA: SARS-CoV-2, NAA: NOT DETECTED

## 2019-07-25 ENCOUNTER — Other Ambulatory Visit: Payer: Self-pay | Admitting: Family Medicine

## 2019-07-25 DIAGNOSIS — Z1231 Encounter for screening mammogram for malignant neoplasm of breast: Secondary | ICD-10-CM

## 2020-03-11 ENCOUNTER — Encounter (HOSPITAL_COMMUNITY): Payer: Self-pay

## 2020-04-05 ENCOUNTER — Emergency Department: Payer: BC Managed Care – PPO

## 2020-04-05 ENCOUNTER — Emergency Department
Admission: EM | Admit: 2020-04-05 | Discharge: 2020-04-05 | Disposition: A | Discharge status: Home / Self Care | Payer: BC Managed Care – PPO | Attending: Emergency Medicine | Admitting: Emergency Medicine

## 2020-04-05 ENCOUNTER — Other Ambulatory Visit: Payer: Self-pay

## 2020-04-05 DIAGNOSIS — W19XXXA Unspecified fall, initial encounter: Secondary | ICD-10-CM

## 2020-04-05 DIAGNOSIS — S63501A Unspecified sprain of right wrist, initial encounter: Secondary | ICD-10-CM | POA: Insufficient documentation

## 2020-04-05 DIAGNOSIS — S022XXA Fracture of nasal bones, initial encounter for closed fracture: Secondary | ICD-10-CM | POA: Diagnosis not present

## 2020-04-05 DIAGNOSIS — I10 Essential (primary) hypertension: Secondary | ICD-10-CM | POA: Diagnosis not present

## 2020-04-05 DIAGNOSIS — Z87891 Personal history of nicotine dependence: Secondary | ICD-10-CM | POA: Diagnosis not present

## 2020-04-05 DIAGNOSIS — W010XXA Fall on same level from slipping, tripping and stumbling without subsequent striking against object, initial encounter: Secondary | ICD-10-CM | POA: Insufficient documentation

## 2020-04-05 DIAGNOSIS — S8001XA Contusion of right knee, initial encounter: Secondary | ICD-10-CM | POA: Diagnosis not present

## 2020-04-05 DIAGNOSIS — Z79899 Other long term (current) drug therapy: Secondary | ICD-10-CM | POA: Insufficient documentation

## 2020-04-05 DIAGNOSIS — S0992XA Unspecified injury of nose, initial encounter: Secondary | ICD-10-CM | POA: Diagnosis present

## 2020-04-05 DIAGNOSIS — Z96653 Presence of artificial knee joint, bilateral: Secondary | ICD-10-CM | POA: Diagnosis not present

## 2020-04-05 MED ORDER — HYDROCODONE-ACETAMINOPHEN 5-325 MG PO TABS
1.0000 | ORAL_TABLET | Freq: Once | ORAL | Status: AC
  Administered 2020-04-05: 1 via ORAL
  Filled 2020-04-05: qty 1

## 2020-04-05 MED ORDER — HYDROCODONE-ACETAMINOPHEN 5-325 MG PO TABS
1.0000 | ORAL_TABLET | Freq: Four times a day (QID) | ORAL | 0 refills | Status: AC | PRN
Start: 2020-04-05 — End: ?

## 2020-04-05 NOTE — Discharge Instructions (Signed)
Follow-up with Dr. Andee Poles for reevaluation of your nose Follow-up with Dr. Odis Luster for reevaluation of your wrist, due to the fall on outstretched hand I have concerns that he could have a scaphoid fracture. Take the pain medication as needed.  Apply ice.  Elevate the hand and wrist. If you have more swelling and cannot breathe through your nose please return emergency department

## 2020-04-05 NOTE — ED Provider Notes (Signed)
[facility] Emergency Department Provider Note  ____________________________________________   First MD Initiated Contact with Patient 04/05/20 1631     (approximate)  I have reviewed the triage vital signs and the nursing notes.   HISTORY  Chief Complaint Fall    HPI Stacy Bass is a 54 y.o. female presents emergency department after falling earlier today.  She states that she tripped over her feet and landed directly on her nose, right knee and right arm/hand.  States her nose began to bleed.  She states she heard her nose crack and nose is broken.  She denies any LOC.  No neck pain.  No chest pain shortness of breath.    Past Medical History:  Diagnosis Date  . Arthritis   . GERD (gastroesophageal reflux disease)   . Hypertension   . Morbid obesity [facility])     Patient Active Problem List   Diagnosis Date Noted  . 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 Hiatal hernia 08/16/2016  . Obesity, Class III, BMI 40-49.9 (morbid obesity) ([facility]) 08/16/2016  . Allergic rhinitis 02/05/2014  . Arthritis involving multiple sites 02/05/2014  . Dyslipidemia 02/05/2014  . GERD (gastroesophageal reflux disease) 02/05/2014  . Hypertension 02/05/2014  . Migraines 02/05/2014  . Vitamin D deficiency 02/05/2014    Past Surgical History:  Procedure Laterality Date  . ABDOMINAL HYSTERECTOMY    . KNEE ARTHROPLASTY Bilateral 9-10 years  . LAPAROSCOPIC GASTRIC SLEEVE RESECTION WITH HIATAL HERNIA REPAIR N/A 08/16/2016   Procedure: LAPAROSCOPIC GASTRIC SLEEVE RESECTION WITH HIATAL HERNIA REPAIR, UPPER ENDO;  Surgeon: Gaynelle Adu, MD;  Location: [facility] ORS;  Service: General;  Laterality: N/A;    Prior to Admission medications   Medication Sig Start Date End Date Taking? Authorizing Provider  HYDROcodone-acetaminophen (NORCO/VICODIN) 5-325 MG tablet Take 1 tablet by mouth every 6 (six) hours as needed for moderate pain. 04/05/20   Aby Gessel, Roselyn Bering, PA-C  losartan  (COZAAR) 100 MG tablet Take 100 mg by mouth every morning.  02/25/15   [provider]  ranitidine (ZANTAC) 150 MG tablet Take 150 mg by mouth 2 (two) times daily as needed for heartburn.     [provider]  tiZANidine (ZANAFLEX) 4 MG tablet Take 4 mg by mouth every 8 (eight) hours as needed for muscle spasms.  05/23/16   [provider]  Vitamin D, Ergocalciferol, (DRISDOL) 50000 units CAPS capsule Take 50,000 Units by mouth every 7 (seven) days. On Thursdays 04/18/16   [provider]    Allergies Morphine and related  No family history on file.  Social History Social History   Tobacco Use  . Smoking status: Former Smoker    Packs/day: 1.00    Types: Cigarettes    Quit date: 06/13/2016    Years since quitting: 3.8  . Smokeless tobacco: Never Used  . Tobacco comment: greater than 30   Substance Use Topics  . Alcohol use: Yes    Comment: seldom   . Drug use: No    Review of Systems  Constitutional: No fever/chills Eyes: No visual changes. ENT: No sore throat.  Positive nasal bone injury Respiratory: Denies cough Cardiovascular: Denies chest pain Genitourinary: Negative for dysuria. Musculoskeletal: Negative for back pain.  Positive for right wrist and right hand pain Skin: Negative for rash. Psychiatric: no mood changes,     ____________________________________________   PHYSICAL EXAM:  VITAL SIGNS: ED Triage Vitals  Enc Vitals Group     BP 04/05/20 1457 107/63  Pulse Rate 04/05/20 1457 76     Resp 04/05/20 1457 (!) 21     Temp 04/05/20 1457 98 F (36.7 C)     Temp Source 04/05/20 1457 Oral     SpO2 04/05/20 1457 100 %     Weight 04/05/20 1509 240 lb (108.9 kg)     Height 04/05/20 1509 5\' 7"  (1.702 m)     Head Circumference --      Peak Flow --      Pain Score 04/05/20 1509 7     Pain Loc --      Pain Edu? --      Excl. in GC? --     Constitutional: Alert and oriented. Well appearing and in no acute  distress. Eyes: Conjunctivae are normal.  Head: Facial swelling noted around the nasal bones Nose: No congestion/rhinnorhea.  No septal hematoma noted Mouth/Throat: Mucous membranes are moist.   Neck:  supple no lymphadenopathy noted Cardiovascular: Normal rate, regular rhythm. Respiratory: Normal respiratory effort.  No retractions,  GU: deferred Musculoskeletal: FROM all extremities, warm and well perfused, right hand has swelling and bruising noted around the third metacarpal distally, right wrist is tender more so in the snuffbox, neurovascular is intact, patient did remove rings due to the mild swelling Neurologic:  Normal speech and language.  Skin:  Skin is warm, dry, positive abrasions. No rash noted. Psychiatric: Mood and affect are normal. Speech and behavior are normal.  ____________________________________________   LABS (all labs ordered are listed, but only abnormal results are displayed)  Labs Reviewed - No data to display ____________________________________________   ____________________________________________  RADIOLOGY  CT maxillofacial  X-ray right wrist and right hand  ____________________________________________   PROCEDURES  Procedure(s) performed: No  Procedures    ____________________________________________   INITIAL IMPRESSION / ASSESSMENT AND PLAN / ED COURSE  Pertinent labs & imaging results that were available during my care of the patient were reviewed by me and considered in my medical decision making (see chart for details).   Patient is 54 year old female presents after a fall.  See HPI.  Physical exam shows nasal bones to be swollen, no septal hematoma, right wrist and right hand tenderness.  DDx: Facial bone fracture, right wrist fracture, right hand fracture, cervical strain/sprain, knee contusion  X-ray of the right hand and right wrist CT maxillofacial  CT maxillofacial shows bilateral nasal bone fractures with deviation to  the right X-ray of the right hand and right wrist are negative for fracture  Due to the fall being on outstretched hand I am still concerned about a scaphoid fracture.  Patient was placed in a thumb spica brace.  She was given a prescription for Vicodin for pain due to nasal bone fractures.  She is to follow-up with ENT for reevaluation of the nasal bone fractures.  Return the emergency department if worsening.  Follow-up with orthopedics in 1 week for reevaluation of the wrist and scaphoid bone.  She is apply ice to all areas that hurt.  She was discharged in stable condition care of her husband.     MYRICAL ANDUJO was evaluated in Emergency Department on 04/05/2020 for the symptoms described in the history of present illness. She was evaluated in the context of the global COVID-19 pandemic, which necessitated consideration that the patient might be at risk for infection with the SARS-CoV-2 virus that causes COVID-19. Institutional protocols and algorithms that pertain to the evaluation of patients at risk for COVID-19 are in a state of  rapid change based on information released by regulatory bodies including the CDC and federal and state organizations. These policies and algorithms were followed during the patient's care in the ED.    As part of my medical decision making, I reviewed the following data within the electronic MEDICAL RECORD NUMBER History obtained from family, Nursing notes reviewed and incorporated, Old chart reviewed, Radiograph reviewed , Notes from prior ED visits and [place] Controlled Substance Database  ____________________________________________   FINAL CLINICAL IMPRESSION(S) / ED DIAGNOSES  Final diagnoses:  Closed fracture of nasal bone, initial encounter  Fall, initial encounter  Wrist sprain, right, initial encounter  Contusion of right knee, initial encounter      NEW MEDICATIONS STARTED DURING THIS VISIT:  Discharge Medication List as of 04/05/2020  5:39 PM    START  taking these medications   Details  HYDROcodone-acetaminophen (NORCO/VICODIN) 5-325 MG tablet Take 1 tablet by mouth every 6 (six) hours as needed for moderate pain., Starting Sun 04/05/2020, Normal         Note:  This document was prepared using Dragon voice recognition software and may include unintentional dictation errors.    Faythe Ghee, PA-C 04/05/20 Penni Homans, MD 04/05/20 2128

## 2020-04-05 NOTE — ED Triage Notes (Signed)
Pt comes via POv from home with c/o fall after getting feet tangled. Pt states pain to knee, right arm and hand. Pt has dried blood noted to nose.  Pt states she heard her nose crack when she fell forward.

## 2021-03-11 ENCOUNTER — Encounter (HOSPITAL_COMMUNITY): Payer: Self-pay | Admitting: *Deleted

## 2021-05-25 ENCOUNTER — Other Ambulatory Visit
Admission: RE | Admit: 2021-05-25 | Discharge: 2021-05-25 | Disposition: A | Discharge status: Home / Self Care | Payer: BC Managed Care – PPO | Source: Ambulatory Visit | Attending: Family Medicine | Admitting: Family Medicine

## 2021-05-25 DIAGNOSIS — R079 Chest pain, unspecified: Secondary | ICD-10-CM | POA: Diagnosis not present

## 2021-05-25 LAB — D-DIMER, QUANTITATIVE: D-Dimer, Quant: 0.34 ug/mL-FEU (ref 0.00–0.50)

## 2021-07-21 ENCOUNTER — Other Ambulatory Visit: Payer: Self-pay | Admitting: Gerontology

## 2021-07-21 DIAGNOSIS — R1011 Right upper quadrant pain: Secondary | ICD-10-CM

## 2021-07-21 DIAGNOSIS — R0781 Pleurodynia: Secondary | ICD-10-CM

## 2021-07-28 ENCOUNTER — Ambulatory Visit
Admission: RE | Admit: 2021-07-28 | Discharge: 2021-07-28 | Disposition: A | Discharge status: Home / Self Care | Payer: BC Managed Care – PPO | Source: Ambulatory Visit | Attending: Gerontology | Admitting: Gerontology

## 2021-07-28 ENCOUNTER — Other Ambulatory Visit: Payer: Self-pay

## 2021-07-28 DIAGNOSIS — R1011 Right upper quadrant pain: Secondary | ICD-10-CM | POA: Insufficient documentation

## 2021-07-28 DIAGNOSIS — R0781 Pleurodynia: Secondary | ICD-10-CM | POA: Insufficient documentation

## 2022-03-04 ENCOUNTER — Encounter (HOSPITAL_COMMUNITY): Payer: Self-pay | Admitting: *Deleted

## 2022-06-30 IMAGING — DX DG HAND COMPLETE 3+V*R*
3 series · 3 of 3 positions shown · non-contrast
Comparison: None.

CLINICAL DATA: Right hand and wrist pain after fall

EXAM:
RIGHT HAND - COMPLETE 3+ VIEW; RIGHT WRIST - COMPLETE 3+ VIEW

[hand ap]
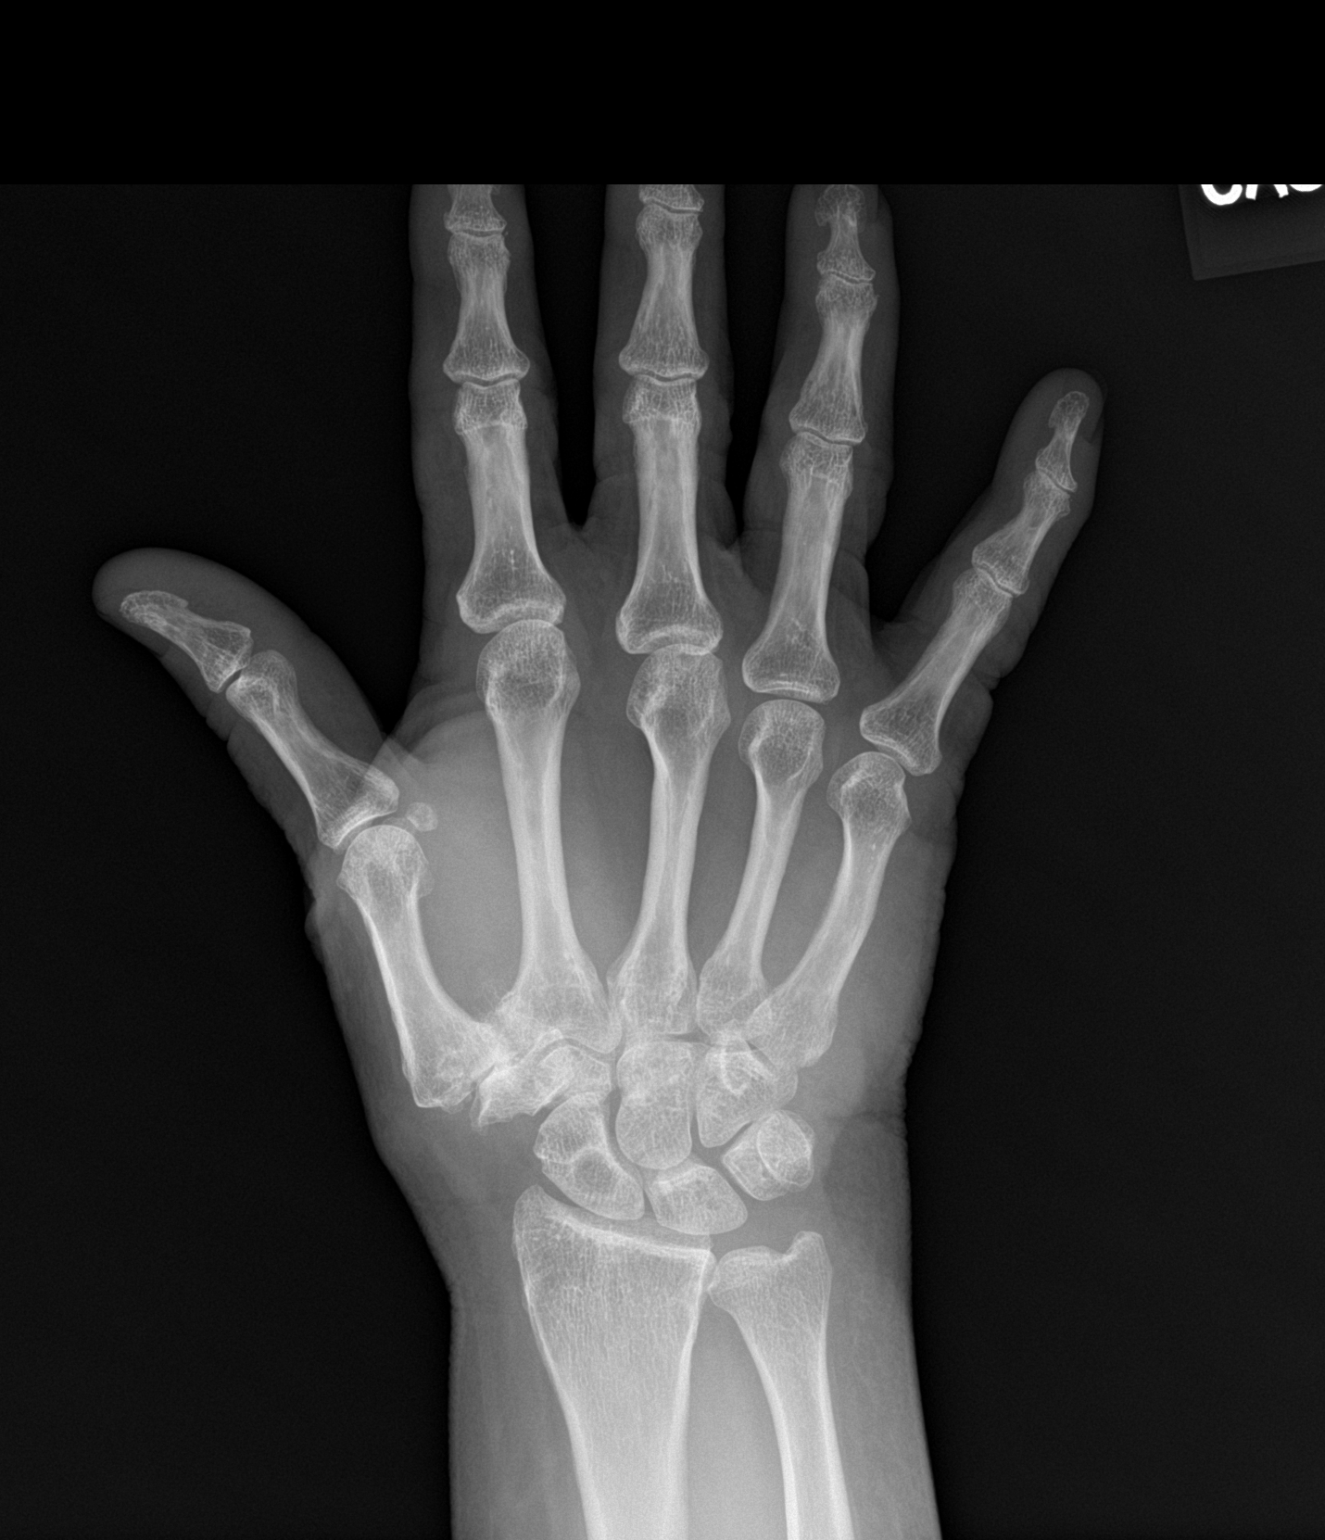

[hand obl]
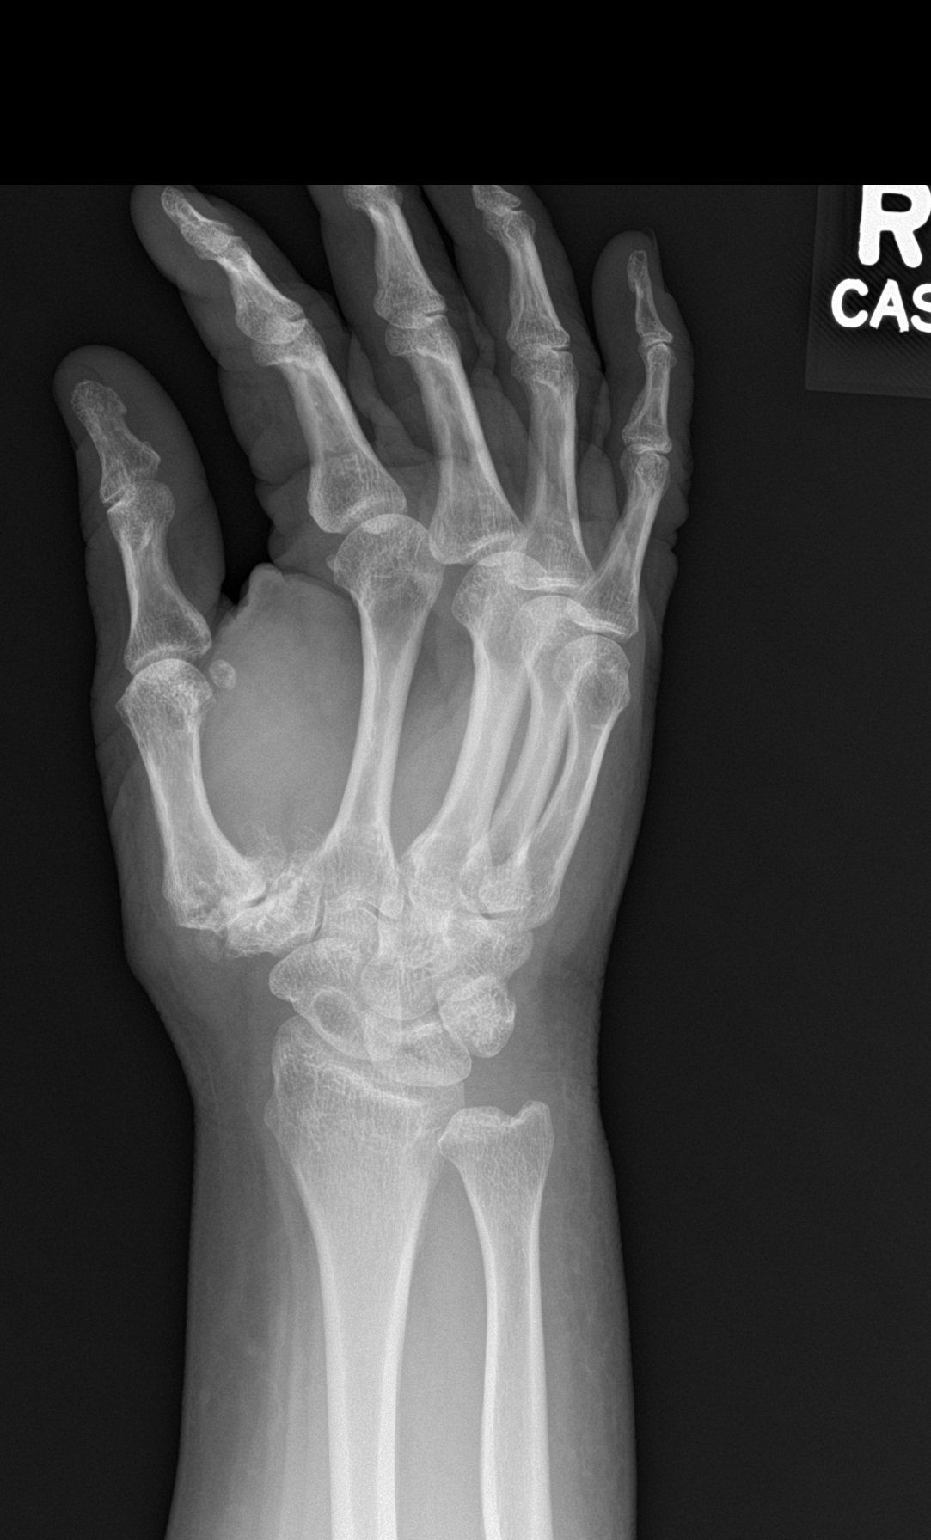

[hand lat]
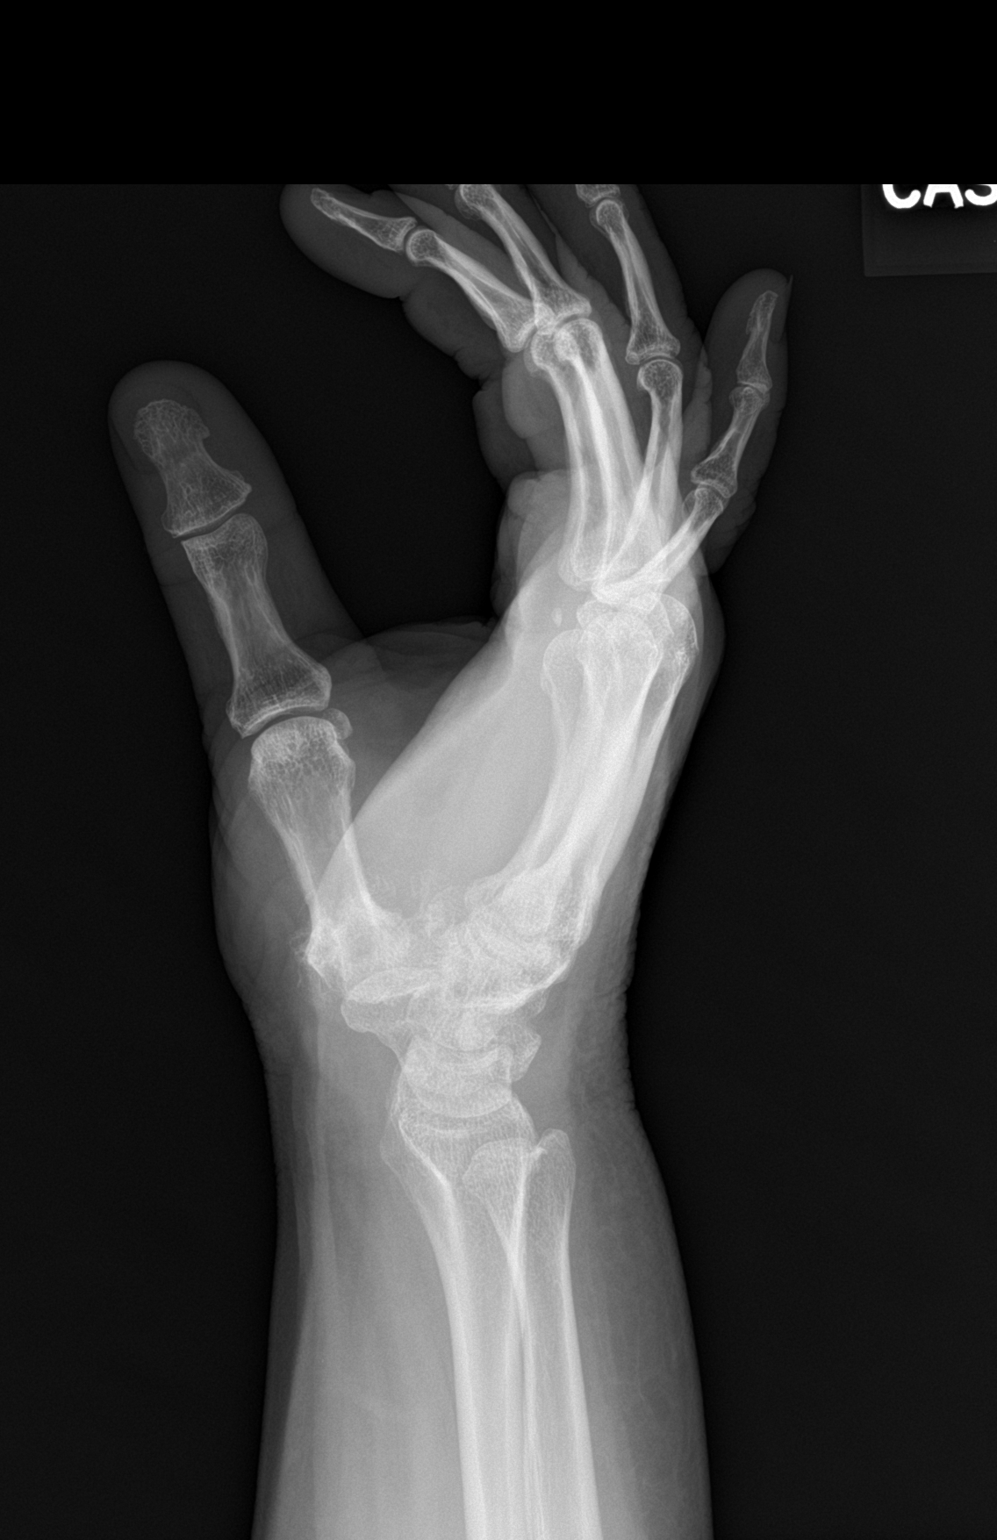

[3 of 3 positions shown; findings below may reference images not displayed]

FINDINGS: No acute fracture or dislocation. Moderate to severe arthropathy of
the first carpometacarpal joint with prominent marginal
osteophytosis. Slight radial subluxation of the first CMC joint
without dislocation. 8 mm ovoid lucency within the mid pole of the
scaphoid, which may represent a subchondral cyst or intraosseous
ganglion. No discrete bony erosion. No focal soft tissue swelling.
IMPRESSION: 1. No acute fracture or dislocation of the right hand or wrist.
2. Moderate to severe osteoarthritis of the first carpometacarpal
joint.
3. 8 mm ovoid lucency within the mid pole of the scaphoid may
represent a subchondral cyst or intraosseous ganglion.

## 2023-03-09 ENCOUNTER — Encounter (HOSPITAL_COMMUNITY): Payer: Self-pay | Admitting: *Deleted

## 2023-09-13 ENCOUNTER — Other Ambulatory Visit: Payer: Self-pay | Admitting: Cardiology

## 2023-09-13 DIAGNOSIS — Z720 Tobacco use: Secondary | ICD-10-CM

## 2023-09-13 DIAGNOSIS — I10 Essential (primary) hypertension: Secondary | ICD-10-CM

## 2023-09-13 DIAGNOSIS — I498 Other specified cardiac arrhythmias: Secondary | ICD-10-CM

## 2023-09-13 DIAGNOSIS — Z8249 Family history of ischemic heart disease and other diseases of the circulatory system: Secondary | ICD-10-CM

## 2023-09-13 DIAGNOSIS — E785 Hyperlipidemia, unspecified: Secondary | ICD-10-CM

## 2023-09-13 DIAGNOSIS — R0789 Other chest pain: Secondary | ICD-10-CM

## 2023-10-04 ENCOUNTER — Telehealth (HOSPITAL_COMMUNITY): Payer: Self-pay | Admitting: Emergency Medicine

## 2023-10-04 ENCOUNTER — Other Ambulatory Visit: Payer: Self-pay | Admitting: Cardiology

## 2023-10-04 DIAGNOSIS — R079 Chest pain, unspecified: Secondary | ICD-10-CM

## 2023-10-04 NOTE — Telephone Encounter (Signed)
 Reaching out to patient to offer assistance regarding upcoming cardiac imaging study; pt verbalizes understanding of appt date/time, parking situation and where to check in, pre-test NPO status and medications ordered, and verified current allergies; name and call back number provided for further questions should they arise Rockwell Alexandria RN Navigator Cardiac Imaging [facility] [phone] office [phone] cell

## 2023-10-04 NOTE — Progress Notes (Unsigned)
 Consent for PET CT Cardiac stress test  [facility] Evy Lutterman]

## 2023-10-04 NOTE — Telephone Encounter (Signed)
 error

## 2023-10-05 ENCOUNTER — Ambulatory Visit
Admission: RE | Admit: 2023-10-05 | Discharge: 2023-10-05 | Disposition: A | Discharge status: Home / Self Care | Source: Ambulatory Visit | Attending: Cardiology | Admitting: Cardiology

## 2023-10-05 DIAGNOSIS — K449 Diaphragmatic hernia without obstruction or gangrene: Secondary | ICD-10-CM | POA: Insufficient documentation

## 2023-10-05 DIAGNOSIS — I498 Other specified cardiac arrhythmias: Secondary | ICD-10-CM

## 2023-10-05 DIAGNOSIS — I10 Essential (primary) hypertension: Secondary | ICD-10-CM | POA: Diagnosis present

## 2023-10-05 DIAGNOSIS — S2241XS Multiple fractures of ribs, right side, sequela: Secondary | ICD-10-CM | POA: Diagnosis not present

## 2023-10-05 DIAGNOSIS — R0789 Other chest pain: Secondary | ICD-10-CM

## 2023-10-05 DIAGNOSIS — I700 Atherosclerosis of aorta: Secondary | ICD-10-CM | POA: Diagnosis not present

## 2023-10-05 DIAGNOSIS — Z9889 Other specified postprocedural states: Secondary | ICD-10-CM | POA: Insufficient documentation

## 2023-10-05 DIAGNOSIS — E785 Hyperlipidemia, unspecified: Secondary | ICD-10-CM

## 2023-10-05 DIAGNOSIS — Z720 Tobacco use: Secondary | ICD-10-CM

## 2023-10-05 DIAGNOSIS — X58XXXS Exposure to other specified factors, sequela: Secondary | ICD-10-CM | POA: Insufficient documentation

## 2023-10-05 DIAGNOSIS — Z8249 Family history of ischemic heart disease and other diseases of the circulatory system: Secondary | ICD-10-CM

## 2023-10-05 MED ORDER — REGADENOSON 0.4 MG/5ML IV SOLN
INTRAVENOUS | Status: AC
  Filled 2023-10-05: qty 5

## 2023-10-05 MED ORDER — RUBIDIUM RB82 GENERATOR (RUBYFILL)
25.0000 | PACK | Freq: Once | INTRAVENOUS | Status: AC
  Administered 2023-10-05: 25.29 via INTRAVENOUS

## 2023-10-05 MED ORDER — RUBIDIUM RB82 GENERATOR (RUBYFILL)
25.0000 | PACK | Freq: Once | INTRAVENOUS | Status: AC
  Administered 2023-10-05: 25.33 via INTRAVENOUS

## 2023-10-05 MED ORDER — REGADENOSON 0.4 MG/5ML IV SOLN
0.4000 mg | Freq: Once | INTRAVENOUS | Status: AC
  Administered 2023-10-05: 0.4 mg via INTRAVENOUS
  Filled 2023-10-05: qty 5

## 2023-10-06 LAB — NM PET CT CARDIAC PERFUSION MULTI W/ABSOLUTE BLOODFLOW
MBFR: 3
Nuc Rest EF: 67 %
Nuc Stress EF: 73 %
Peak HR: 90 {beats}/min
Rest HR: 70 {beats}/min
Rest MBF: 1 ml/g/min
Rest Nuclear Isotope Dose: 25.3 mCi
SRS: 1
SSS: 1
ST Depression (mm): 0 mm
Stress MBF: 3 ml/g/min
Stress Nuclear Isotope Dose: 25.3 mCi
TID: 1.04

## 2023-10-22 IMAGING — US US ABDOMEN COMPLETE
1 series · 14 of 25 positions shown · non-contrast
Comparison: None.

CLINICAL DATA: Right upper quadrant abdominal pain.

EXAM:
ABDOMEN ULTRASOUND COMPLETE

[Series 1: us abdomen complete · 0.25mm/px · 14 of 85 slices shown]
[im 1/85]
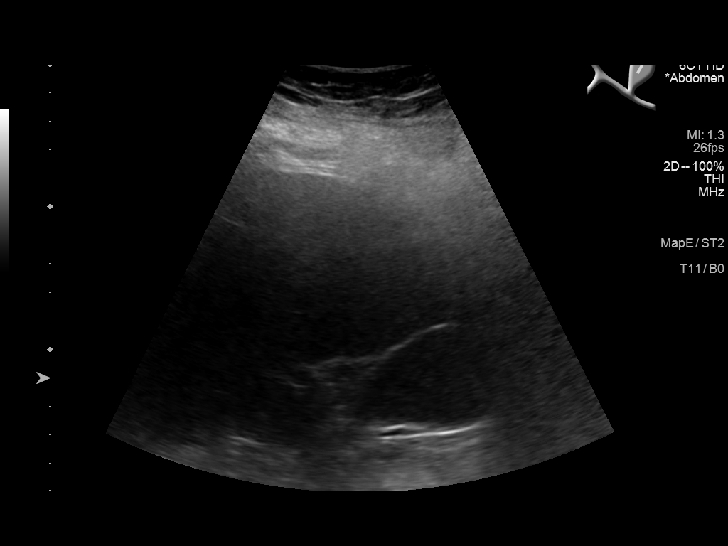
[im 8/85]
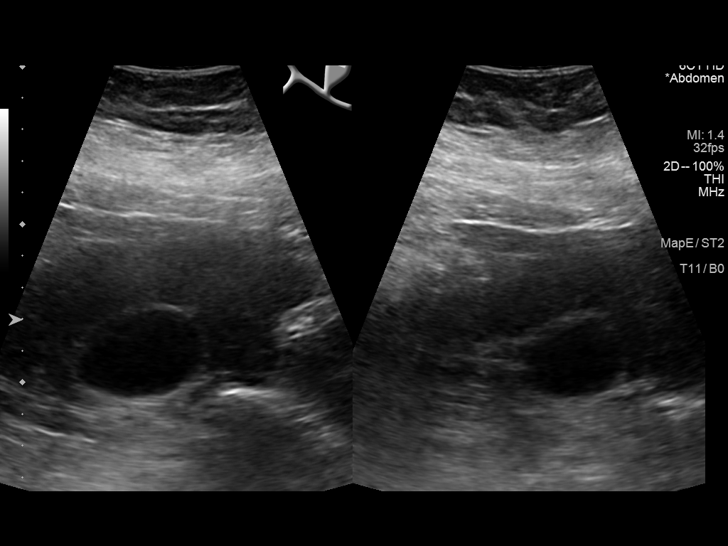
[im 15/85]
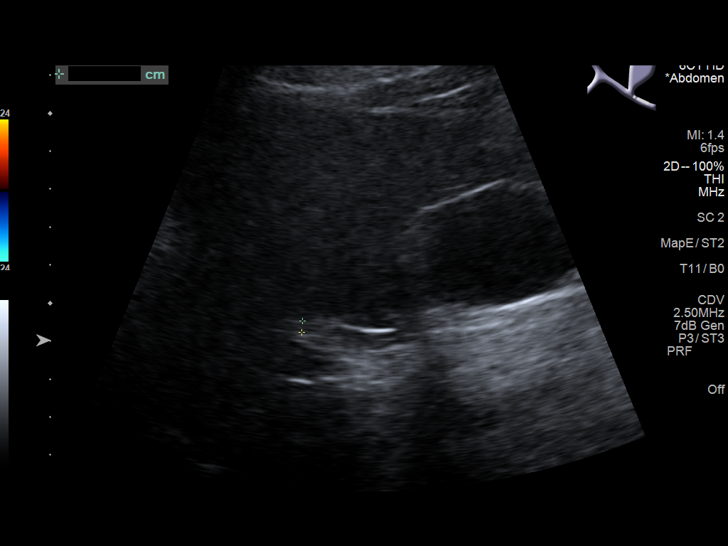
[im 22/85]
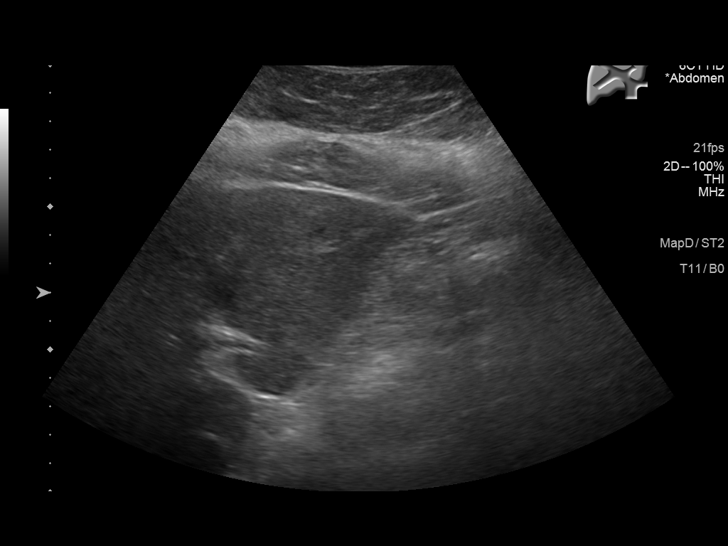
[im 29/85]
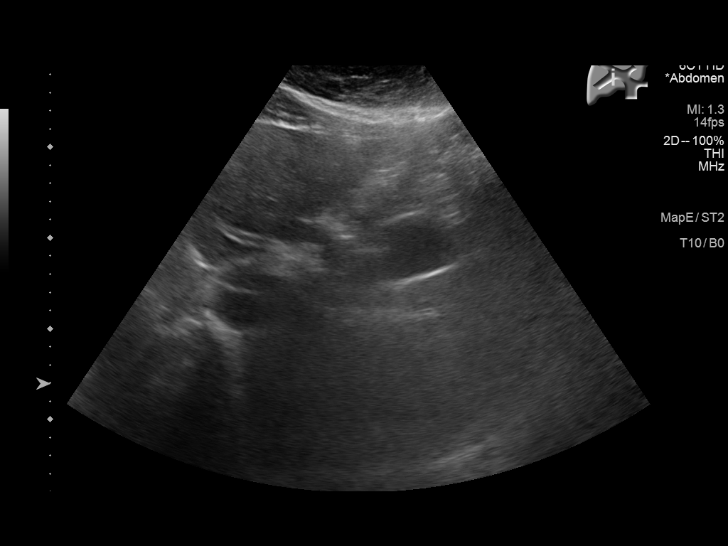
[im 32/85]
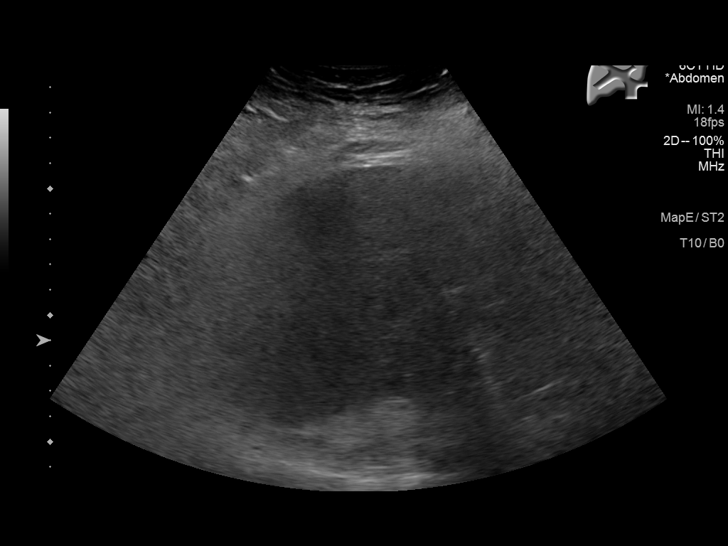
[im 39/85]
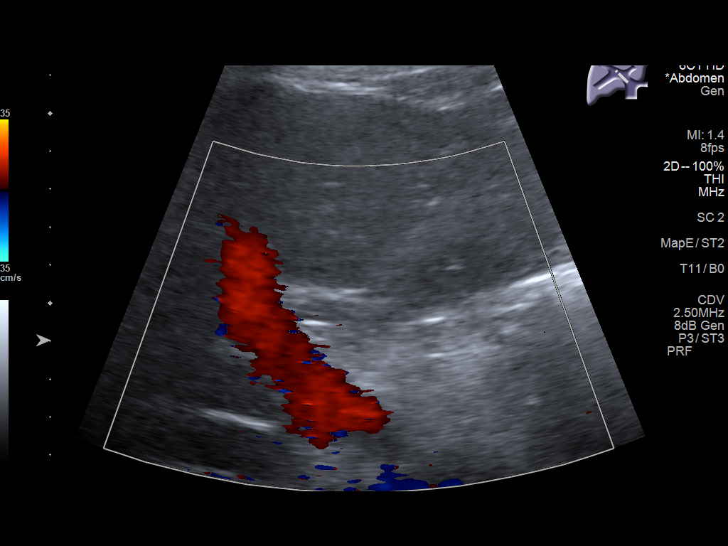
[im 46/85]
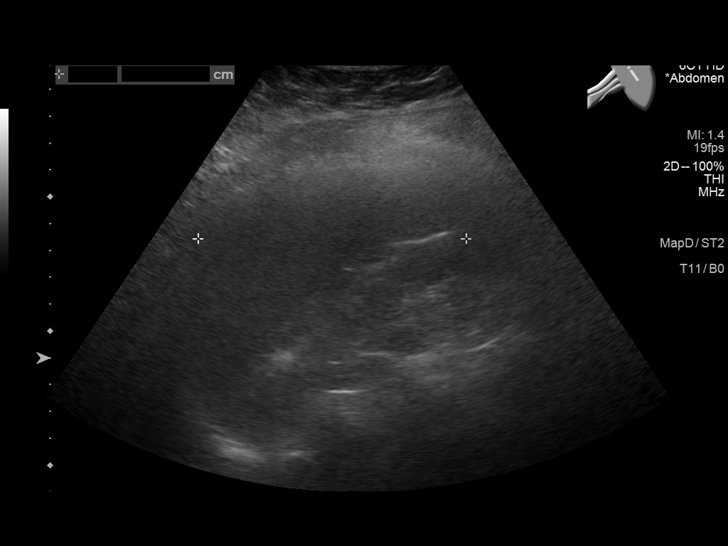
[im 53/85]
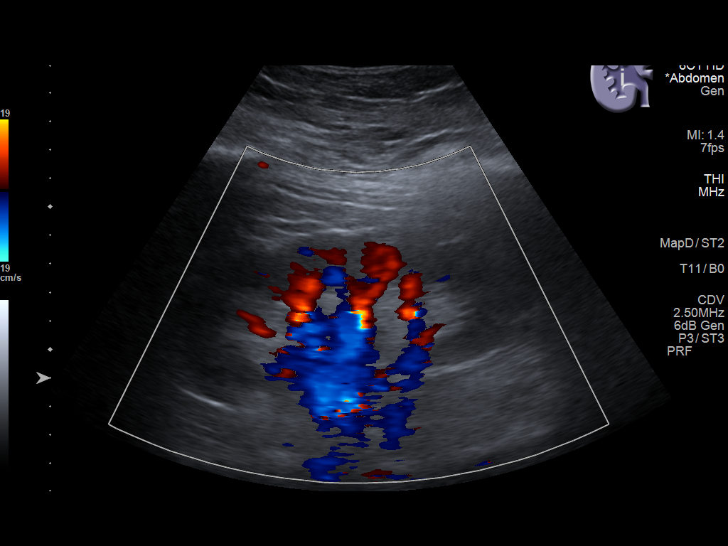
[im 57/85]
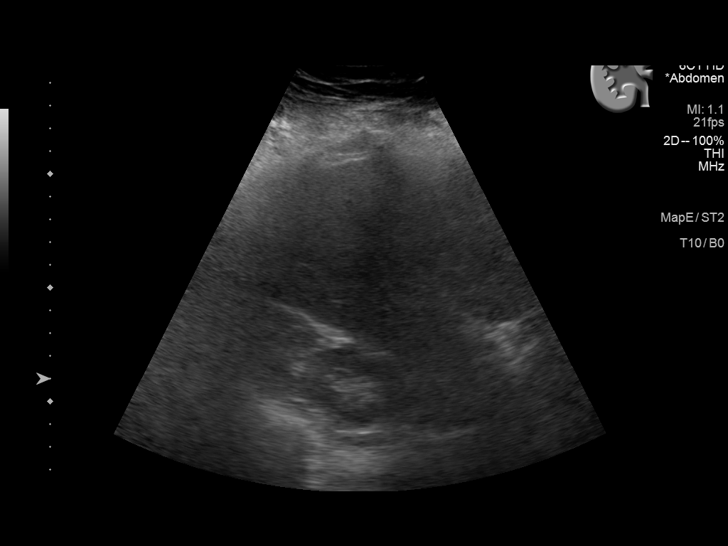
[im 64/85]
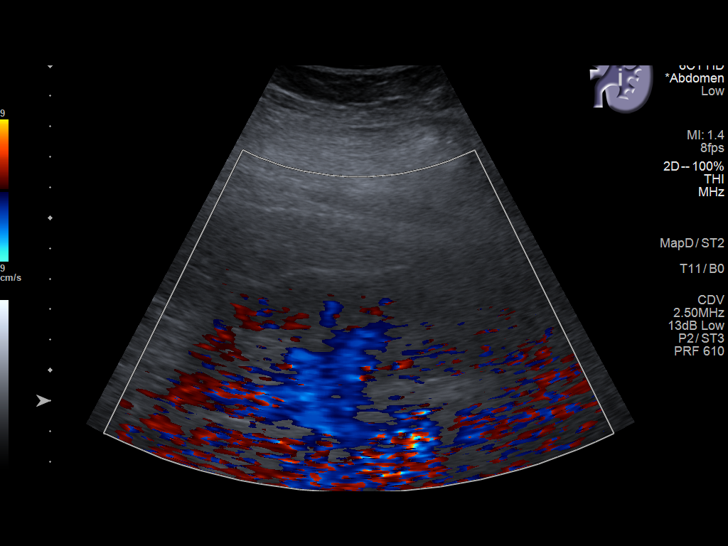
[im 71/85]
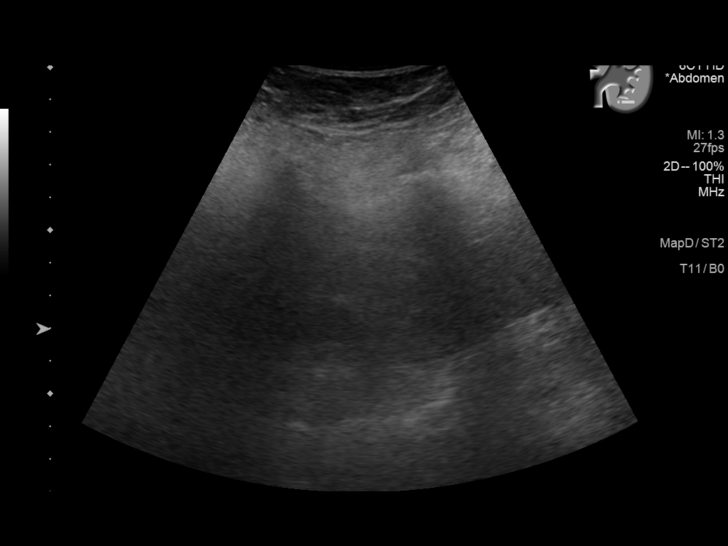
[im 78/85]
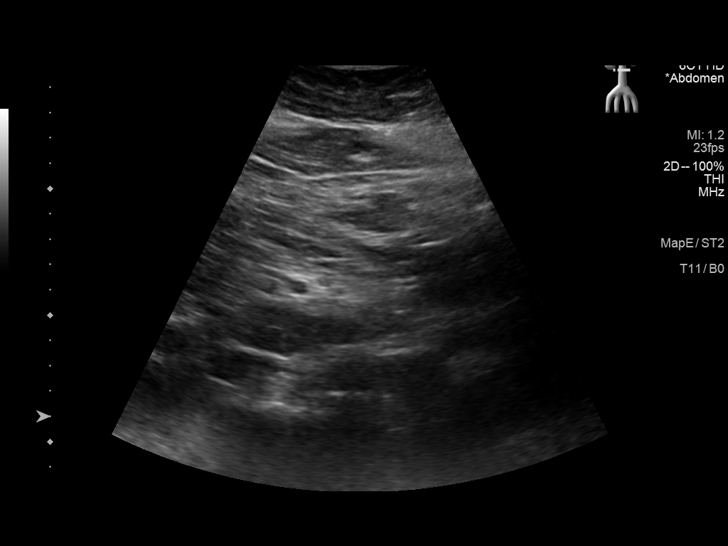
[im 85/85]
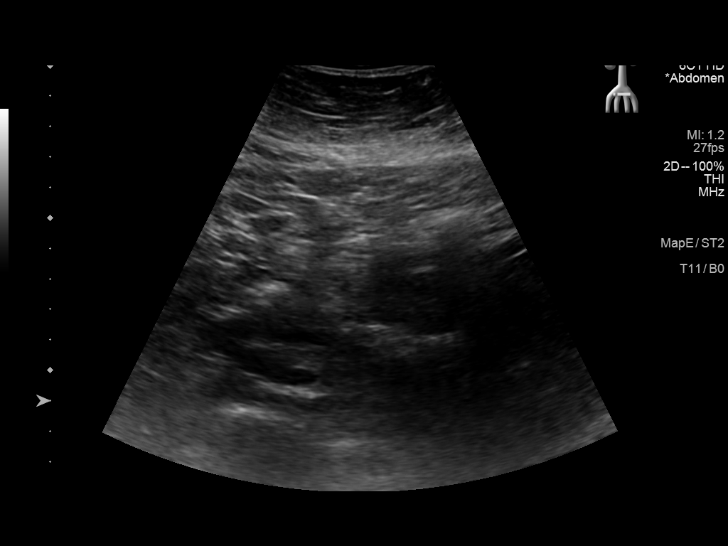

[14 of 25 positions shown; findings below may reference images not displayed]

FINDINGS: Gallbladder: No gallstones or wall thickening visualized. No
sonographic Murphy sign noted by sonographer.

Common bile duct: Diameter: 3 mm

Liver: There is diffuse increased liver echogenicity most commonly
seen in the setting of fatty infiltration. Superimposed inflammation
or fibrosis is not excluded. Clinical correlation is recommended.
Portal vein is patent on color Doppler imaging with normal direction
of blood flow towards the liver.

IVC: No abnormality visualized.

Pancreas: Visualized portion unremarkable.

Spleen: Size and appearance within normal limits.

Right Kidney: Length: 11.9 cm. Echogenicity within normal limits. No
mass or hydronephrosis visualized.

Left Kidney: Length: 10.1 cm. Echogenicity within normal limits. No
mass or hydronephrosis visualized.

Abdominal aorta: No aneurysm visualized.

Other findings: None.
IMPRESSION: Fatty liver, otherwise unremarkable abdominal ultrasound.

## 2023-11-02 ENCOUNTER — Other Ambulatory Visit

## 2024-03-12 ENCOUNTER — Encounter (HOSPITAL_COMMUNITY): Payer: Self-pay | Admitting: *Deleted
